# Patient Record
Sex: Female | Born: 1970 | Race: White | Hispanic: No | Marital: Married | Smoking: Never smoker
Health system: Southern US, Community
[De-identification: ages and names within clinical notes are randomized; demographics above are authoritative.]

## PROBLEM LIST (undated history)

## (undated) ENCOUNTER — Inpatient Hospital Stay (HOSPITAL_COMMUNITY): Payer: Self-pay

## (undated) DIAGNOSIS — F419 Anxiety disorder, unspecified: Secondary | ICD-10-CM

## (undated) DIAGNOSIS — R000 Tachycardia, unspecified: Secondary | ICD-10-CM

## (undated) DIAGNOSIS — I82409 Acute embolism and thrombosis of unspecified deep veins of unspecified lower extremity: Secondary | ICD-10-CM

## (undated) DIAGNOSIS — J302 Other seasonal allergic rhinitis: Secondary | ICD-10-CM

## (undated) DIAGNOSIS — Z86718 Personal history of other venous thrombosis and embolism: Secondary | ICD-10-CM

## (undated) DIAGNOSIS — K449 Diaphragmatic hernia without obstruction or gangrene: Secondary | ICD-10-CM

## (undated) DIAGNOSIS — I871 Compression of vein: Secondary | ICD-10-CM

## (undated) DIAGNOSIS — Z8742 Personal history of other diseases of the female genital tract: Secondary | ICD-10-CM

## (undated) DIAGNOSIS — IMO0001 Reserved for inherently not codable concepts without codable children: Secondary | ICD-10-CM

## (undated) HISTORY — DX: Acute embolism and thrombosis of unspecified deep veins of unspecified lower extremity: I82.409

## (undated) HISTORY — PX: WISDOM TOOTH EXTRACTION: SHX21

## (undated) HISTORY — DX: Tachycardia, unspecified: R00.0

## (undated) HISTORY — DX: Other seasonal allergic rhinitis: J30.2

## (undated) HISTORY — DX: Diaphragmatic hernia without obstruction or gangrene: K44.9

---

## 2005-03-01 ENCOUNTER — Emergency Department: Payer: Self-pay | Admitting: Emergency Medicine

## 2005-05-07 ENCOUNTER — Ambulatory Visit: Payer: Self-pay | Admitting: Urology

## 2005-05-07 ENCOUNTER — Ambulatory Visit: Payer: Self-pay | Admitting: Unknown Physician Specialty

## 2006-01-28 ENCOUNTER — Ambulatory Visit: Payer: Self-pay | Admitting: Unknown Physician Specialty

## 2006-02-03 ENCOUNTER — Ambulatory Visit: Payer: Self-pay | Admitting: Unknown Physician Specialty

## 2006-02-18 ENCOUNTER — Ambulatory Visit: Payer: Self-pay | Admitting: Unknown Physician Specialty

## 2006-11-26 ENCOUNTER — Emergency Department: Payer: Self-pay | Admitting: Emergency Medicine

## 2007-07-21 ENCOUNTER — Ambulatory Visit: Payer: Self-pay | Admitting: Urology

## 2007-08-27 ENCOUNTER — Ambulatory Visit: Payer: Self-pay | Admitting: Internal Medicine

## 2008-02-12 ENCOUNTER — Ambulatory Visit: Payer: Self-pay

## 2010-11-29 ENCOUNTER — Ambulatory Visit: Payer: Self-pay | Admitting: Urology

## 2011-07-09 ENCOUNTER — Ambulatory Visit (INDEPENDENT_AMBULATORY_CARE_PROVIDER_SITE_OTHER): Payer: BC Managed Care – PPO | Admitting: Internal Medicine

## 2011-07-09 ENCOUNTER — Encounter: Payer: Self-pay | Admitting: Internal Medicine

## 2011-07-09 VITALS — BP 106/66 | HR 62 | Ht 68.0 in | Wt 142.0 lb

## 2011-07-09 DIAGNOSIS — R000 Tachycardia, unspecified: Secondary | ICD-10-CM

## 2011-07-09 MED ORDER — ATENOLOL 25 MG PO TABS: 25.0000 mg | ORAL_TABLET | Freq: Two times a day (BID) | ORAL | Status: DC

## 2011-07-09 NOTE — Progress Notes (Signed)
[name] is a pleasant 40 y.o. WF patient with a h/o inappropriate sinus tachycardia who presents today to establish EP follow-up. She reports longstanding tachypalpitations for which she has had an extensive evaluation previously at [facility] by Dr Macon Large.  She states that she first developed episodes of "heart racing" in 2002.  She has worn several event monitors which have revealed sinus rhythm/ sinus tachycardia during episodes of "heart racing".  She reports abrupt onset with gradual termination of tachypalpitations with associated shortness of breath and fatigue.  She finds that episodes occur when exercising or when walking up stairs.  She reports that she gets dizzy when she bends over quickly.  Her symptoms were initially well controlled with toprol, however she developed an eventual rash with toprol requiring that she stop this medicine.  She has recently been treated with atenolol 25mg  bid.  She presently feels that her symptoms are reasonably well controlled with this medicine.  Today, she denies symptoms of chest pain, shortness of breath, orthopnea, PND, lower extremity edema, dizziness, presyncope, syncope, or neurologic sequela. The patient is tolerating medications without difficulties and is otherwise without complaint today.   Past Medical History  Diagnosis Date  . Inappropriate sinus tachycardia   . Hiatal hernia   . Seasonal allergies   . Palpitations    No past surgical history on file.  Current Outpatient Prescriptions  Medication Sig Dispense Refill  . atenolol (TENORMIN) 25 MG tablet Take 1 tablet (25 mg total) by mouth 2 (two) times daily.  60 tablet  11  . Cetirizine HCl (ZYRTEC ALLERGY PO) Take by mouth as needed.        . NON FORMULARY Birth control daily         Allergies  Allergen Reactions  . Toprol Xl (Metoprolol Succinate)   . Zithromax (Azithromycin)     History   Social History  . Marital Status: Divorced    Spouse Name: N/A    Number of Children: N/A   . Years of Education: N/A   Occupational History  . Not on file.   Social History Main Topics  . Smoking status: Never Smoker   . Smokeless tobacco: Never Used  . Alcohol Use: No  . Drug Use: No  . Sexually Active: Not on file   Other Topics Concern  . 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    Family History  Problem Relation Age of Onset  . Heart attack Father   . Diabetes Brother   . Heart attack Maternal Grandfather   . Diabetes Father   . Hypertension Father   . Hypertension Brother     ROS- All systems are reviewed and negative except as per the HPI above  Physical Exam: Filed Vitals:   07/09/11 1528  BP: 106/66  Pulse: 62  Height: 5\' 8"  (1.727 m)  Weight: 142 lb (64.411 kg)    GEN- The patient is well appearing, alert and oriented x 3 today.   Head- normocephalic, atraumatic Eyes-  Sclera clear, conjunctiva pink Ears- hearing intact Oropharynx- clear Neck- supple, no JVP Lymph- no cervical lymphadenopathy Lungs- Clear to ausculation bilaterally, normal work of breathing Heart- Regular rate and rhythm, no murmurs, rubs or gallops, PMI not laterally displaced GI- soft, NT, ND, + BS Extremities- no clubbing, cyanosis, or edema MS- no significant deformity or atrophy Skin- no rash or lesion Psych- anxious appearing, full affect Neuro- strength and sensation are  intact  EKG today reveals sinus bradycardia 57 bpm, otherwise normal ekg Echo 12/02/02- LVEF >55%, trivial TR Holter 12/05/02- sinus rhythm and ectopy atrial rhythm, symptoms of "racing" was associated with sinus rhythm, rates 72-93 bpm Holter 06/30/05- sinus rhythm, symptoms of "chest pressure" correlated to sinus rhythm, "racing" was correlated with sinus tachycardia 120s-140s.  Assessment and Plan:

## 2011-07-09 NOTE — Assessment & Plan Note (Signed)
The patient has longstanding hypersensitivity to sinus rhythm.  She has done well with beta blocker therapy.  She presents today to establish ongoing EP care.  She was previously evaluated/ managed by Dr Macon Large at [facility].  As she appears to be doing well at this time, we will continue atenolol. No changes today.  She will return in 12 months.

## 2011-07-09 NOTE — Patient Instructions (Signed)
Your physician wants you to follow-up in: 12 months with Dr Allred You will receive a reminder letter in the mail two months in advance. If you don't receive a letter, please call our office to schedule the follow-up appointment.  

## 2011-07-25 ENCOUNTER — Encounter: Payer: Self-pay | Admitting: Internal Medicine

## 2011-07-28 ENCOUNTER — Encounter: Payer: Self-pay | Admitting: Internal Medicine

## 2011-07-31 ENCOUNTER — Encounter: Payer: Self-pay | Admitting: Internal Medicine

## 2011-12-30 NOTE — L&D Delivery Note (Signed)
Delivery Note  SVD viable female Apgars 9,9 over 1st degree ml lac.  Placenta delivered spontaneously intact with 3VC. Repair with 2-0 Chromic with good support and hemostasis noted and R/V exam confirms.  PH art was sent.  [place] cord blood was not available.  Mother and baby were doing well.  EBL 300cc  Candice Camp, MD

## 2012-02-19 ENCOUNTER — Telehealth: Payer: Self-pay | Admitting: Internal Medicine

## 2012-02-19 NOTE — Telephone Encounter (Addendum)
Pt on atenilol 25mg , pt is pregnant and ob told her she  needs to switch to something safer, can get another med called into [facility] in [place] on [address] pt has tried Toprol and had an allergic reaction

## 2012-02-20 ENCOUNTER — Telehealth: Payer: Self-pay | Admitting: *Deleted

## 2012-02-20 NOTE — Telephone Encounter (Signed)
02/20/12--pt calling stating she has not heard back about what med to take for a. Tachycardia --pt is pregnant and OB-GYN wanted her to check with [place] about med she can take while pregnant--spoke with dr Elease Hashimoto who prescribed labatelol 100mg  caps 1 po BID-- CALLED IN TO [facility] ON [address]--#60 WITH 2 REFILLS--pt aware--nt

## 2012-02-20 NOTE — Telephone Encounter (Signed)
Pt calling back, has not heard from nurse re message yesterday, pt requesting call back today, pls call [phone]

## 2012-02-21 ENCOUNTER — Inpatient Hospital Stay (HOSPITAL_COMMUNITY)
Admission: AD | Admit: 2012-02-21 | Discharge: 2012-02-21 | Disposition: A | Discharge status: Home / Self Care | Payer: BC Managed Care – PPO | Source: Ambulatory Visit | Attending: Obstetrics and Gynecology | Admitting: Obstetrics and Gynecology

## 2012-02-21 ENCOUNTER — Inpatient Hospital Stay (HOSPITAL_COMMUNITY): Payer: BC Managed Care – PPO

## 2012-02-21 ENCOUNTER — Encounter (HOSPITAL_COMMUNITY): Payer: Self-pay | Admitting: *Deleted

## 2012-02-21 DIAGNOSIS — O209 Hemorrhage in early pregnancy, unspecified: Secondary | ICD-10-CM | POA: Insufficient documentation

## 2012-02-21 DIAGNOSIS — R109 Unspecified abdominal pain: Secondary | ICD-10-CM | POA: Insufficient documentation

## 2012-02-21 DIAGNOSIS — O4690 Antepartum hemorrhage, unspecified, unspecified trimester: Secondary | ICD-10-CM

## 2012-02-21 DIAGNOSIS — O200 Threatened abortion: Secondary | ICD-10-CM

## 2012-02-21 LAB — CBC
MCH: 30.6 pg (ref 26.0–34.0)
MCV: 89 fL (ref 78.0–100.0)
Platelets: 162 10*3/uL (ref 150–400)
RDW: 12.7 % (ref 11.5–15.5)

## 2012-02-21 LAB — URINALYSIS, ROUTINE W REFLEX MICROSCOPIC
Bilirubin Urine: NEGATIVE
Specific Gravity, Urine: >1.03 — ABNORMAL HIGH (ref 1.005–1.030)
Urobilinogen, UA: 0.2 mg/dL (ref 0.0–1.0)
pH: 5.5 (ref 5.0–8.0)

## 2012-02-21 LAB — URINE MICROSCOPIC-ADD ON

## 2012-02-21 LAB — POCT PREGNANCY, URINE: Preg Test, Ur: POSITIVE — AB

## 2012-02-21 MED ORDER — HYDROCODONE-ACETAMINOPHEN 5-325 MG PO TABS
2.0000 | ORAL_TABLET | Freq: Once | ORAL | Status: AC
  Administered 2012-02-21: 2 via ORAL
  Filled 2012-02-21: qty 2

## 2012-02-21 MED ORDER — RHO D IMMUNE GLOBULIN 1500 UNIT/2ML IJ SOLN
300.0000 ug | Freq: Once | INTRAMUSCULAR | Status: AC
  Administered 2012-02-21: 300 ug via INTRAMUSCULAR
  Filled 2012-02-21: qty 2

## 2012-02-21 NOTE — Progress Notes (Signed)
BHcg was in 5000 range on Thursday this week.

## 2012-02-21 NOTE — ED Provider Notes (Signed)
History   Pt presents today c/o sudden onset of vag bleeding that began around 8pm. She states she has had lower abd cramping all day. She denies vag dc, irritation, fever, dysuria, or any other sx at this time. Her last episode of intercourse was yesterday.  Chief Complaint  Patient presents with  . Vaginal Bleeding   HPI  OB History    Grav Para Term Preterm Abortions TAB SAB Ect Mult Living   3 2 2       2       Past Medical History  Diagnosis Date  . Inappropriate sinus tachycardia   . Seasonal allergies   . Palpitations     Past Surgical History  Procedure Date  . Wisdom tooth extraction     Family History  Problem Relation Age of Onset  . Heart attack Father   . Diabetes Brother   . Heart attack Maternal Grandfather   . Diabetes Father   . Hypertension Father   . Hypertension Brother     History  Substance Use Topics  . Smoking status: Never Smoker   . Smokeless tobacco: Never Used  . Alcohol Use: No    Allergies:  Allergies  Allergen Reactions  . Cheese Anaphylaxis  . Avelox (Moxifloxacin Hcl In Nacl) Hives  . Toprol Xl (Metoprolol Succinate) Hives  . Zithromax (Azithromycin) Hives    Prescriptions prior to admission  Medication Sig Dispense Refill  . cetirizine (ZYRTEC) 10 MG tablet Take 10 mg by mouth daily.      Marland Kitchen ibuprofen (ADVIL,MOTRIN) 200 MG tablet Take 400 mg by mouth every 6 (six) hours as needed. Head aches      . labetalol (NORMODYNE) 100 MG tablet Take 100 mg by mouth 2 (two) times daily.      Marland Kitchen PRESCRIPTION MEDICATION 1 tablet daily. Birth control, pt does not know name of drug or strength.        Review of Systems  Constitutional: Negative for fever and chills.  Eyes: Negative for blurred vision and double vision.  Respiratory: Negative for cough, hemoptysis, sputum production, shortness of breath and wheezing.   Cardiovascular: Negative for chest pain and palpitations.  Gastrointestinal: Positive for abdominal pain. Negative for  nausea, vomiting, diarrhea and constipation.  Genitourinary: Negative for dysuria, urgency, frequency and hematuria.  Neurological: Negative for dizziness and headaches.  Psychiatric/Behavioral: Negative for depression and suicidal ideas.   Physical Exam   Blood pressure 113/78, pulse 97, temperature 98 F (36.7 C), temperature source Oral, resp. rate 18, last menstrual period 01/19/2012.  Physical Exam  Nursing note and vitals reviewed. Constitutional: She is oriented to person, place, and time. She appears well-developed and well-nourished. No distress.  HENT:  Head: Normocephalic and atraumatic.  Eyes: EOM are normal. Pupils are equal, round, and reactive to light.  GI: Soft. She exhibits no distension and no mass. There is tenderness. There is no rebound and no guarding.  Genitourinary: There is bleeding around the vagina. No vaginal discharge found.       Cervix Lg/closed. Moderate amount of red blood noted in vag vault. Uterus slightly tender to palpation. No adnexal masses noted.  Neurological: She is alert and oriented to person, place, and time.  Skin: Skin is warm and dry. She is not diaphoretic.  Psychiatric: She has a normal mood and affect. Her behavior is normal. Judgment and thought content normal.    MAU Course  Procedures  Wet prep and GC/Chlamydia cultures done.  Results for orders placed during  the hospital encounter of 02/21/12 (from the past 24 hour(s))  URINALYSIS, ROUTINE W REFLEX MICROSCOPIC     Status: Abnormal   Collection Time   02/21/12  8:30 PM      Component Value Range   Color, Urine YELLOW  YELLOW    APPearance CLOUDY (*) CLEAR    Specific Gravity, Urine >1.030 (*) 1.005 - 1.030    pH 5.5  5.0 - 8.0    Glucose, UA NEGATIVE  NEGATIVE (mg/dL)   Hgb urine dipstick LARGE (*) NEGATIVE    Bilirubin Urine NEGATIVE  NEGATIVE    Ketones, ur NEGATIVE  NEGATIVE (mg/dL)   Protein, ur NEGATIVE  NEGATIVE (mg/dL)   Urobilinogen, UA 0.2  0.0 - 1.0 (mg/dL)    Nitrite NEGATIVE  NEGATIVE    Leukocytes, UA NEGATIVE  NEGATIVE   URINE MICROSCOPIC-ADD ON     Status: Normal   Collection Time   02/21/12  8:30 PM      Component Value Range   Squamous Epithelial / LPF RARE  RARE    RBC / HPF TOO NUMEROUS TO COUNT  <3 (RBC/hpf)  POCT PREGNANCY, URINE     Status: Abnormal   Collection Time   02/21/12  8:43 PM      Component Value Range   Preg Test, Ur POSITIVE (*) NEGATIVE   ABO/RH     Status: Normal   Collection Time   02/21/12  8:46 PM      Component Value Range   ABO/RH(D) A NEG    CBC     Status: Normal   Collection Time   02/21/12  8:46 PM      Component Value Range   WBC 6.7  4.0 - 10.5 (K/uL)   RBC 4.71  3.87 - 5.11 (MIL/uL)   Hemoglobin 14.4  12.0 - 15.0 (g/dL)   HCT 40.9  81.1 - 91.4 (%)   MCV 89.0  78.0 - 100.0 (fL)   MCH 30.6  26.0 - 34.0 (pg)   MCHC 34.4  30.0 - 36.0 (g/dL)   RDW 78.2  95.6 - 21.3 (%)   Platelets 162  150 - 400 (K/uL)  HCG, QUANTITATIVE, PREGNANCY     Status: Abnormal   Collection Time   02/21/12  8:46 PM      Component Value Range   hCG, Beta Chain, Quant, S 10323 (*) <5 (mIU/mL)  WET PREP, GENITAL     Status: Abnormal   Collection Time   02/21/12  9:00 PM      Component Value Range   Yeast Wet Prep HPF POC NONE SEEN  NONE SEEN    Trich, Wet Prep NONE SEEN  NONE SEEN    Clue Cells Wet Prep HPF POC NONE SEEN  NONE SEEN    WBC, Wet Prep HPF POC FEW (*) NONE SEEN   RH IG WORKUP     Status: Normal (Preliminary result)   Collection Time   02/21/12 10:01 PM      Component Value Range   Gestational Age(Wks) 4.5     ABO/RH(D) A NEG     Antibody Screen PENDING      US Ob Comp Less 14 Wks  02/21/2012  *RADIOLOGY REPORT*  Clinical Data: bleeding; ;  OBSTETRIC <14 WK [place] AND TRANSVAGINAL OB US  Technique: Both transabdominal and transvaginal ultrasound examinations were performed for complete evaluation of the gestation as well as the maternal uterus, adnexal regions, and pelvic cul-de-sac.  Comparison: None.  Findings:  There is a  single intrauterine gestation.  Mean sac diameter is 10.8 mm for an estimated gestational age of [redacted] weeks 6 days.  Yolk sac is present.  No fetal pole currently visualized. There is a large subchorionic hemorrhage present as well.  This somewhat deforms the gestational sac.  Ovaries are symmetric in size and echotexture.  Right corpus luteal cyst present.  There is a 1.4 cm hypoechoic posterior intramural fibroid.  No free fluid.  IMPRESSION: 5-week-6-day intrauterine pregnancy.  No fetal pole currently. There is a large subchorionic hemorrhage present.  Original Report Authenticated By: Cyndie Chime, M.D.   US Ob Transvaginal  02/21/2012  *RADIOLOGY REPORT*  Clinical Data: bleeding; ;  OBSTETRIC <14 WK [place] AND TRANSVAGINAL OB US  Technique: Both transabdominal and transvaginal ultrasound examinations were performed for complete evaluation of the gestation as well as the maternal uterus, adnexal regions, and pelvic cul-de-sac.  Comparison: None.  Findings: There is a single intrauterine gestation.  Mean sac diameter is 10.8 mm for an estimated gestational age of [redacted] weeks 6 days.  Yolk sac is present.  No fetal pole currently visualized. There is a large subchorionic hemorrhage present as well.  This somewhat deforms the gestational sac.  Ovaries are symmetric in size and echotexture.  Right corpus luteal cyst present.  There is a 1.4 cm hypoechoic posterior intramural fibroid.  No free fluid.  IMPRESSION: 5-week-6-day intrauterine pregnancy.  No fetal pole currently. There is a large subchorionic hemorrhage present.  Original Report Authenticated By: Cyndie Chime, M.D.    Discussed pt with Dr. Senaida Ores. She wants pt to f/u in office on Monday. Assessment and Plan  Vag bleeding in preg: discussed with pt at length. She will take tylenol for prn pain. Discussed possibility of threatened miscarriage. Discussed diet, activity, risks, and precautions. She will call Dr. Berenda Morale office on Monday.    Clinton Gallant. Chianna Spirito III, DrHSc, MPAS, PA-C  02/21/2012, 8:59 PM   Henrietta Hoover, PA 02/21/12 2258

## 2012-02-23 LAB — GC/CHLAMYDIA PROBE AMP, GENITAL
Chlamydia, DNA Probe: NEGATIVE
GC Probe Amp, Genital: NEGATIVE

## 2012-02-24 LAB — RH IG WORKUP (INCLUDES ABO/RH)
ABO/RH(D): A NEG
Gestational Age(Wks): 4.5
Unit division: 0

## 2012-03-01 ENCOUNTER — Telehealth: Payer: Self-pay | Admitting: Internal Medicine

## 2012-03-01 NOTE — Telephone Encounter (Signed)
lmom for patient to call me back. 

## 2012-03-01 NOTE — Telephone Encounter (Signed)
Please return call to patient [phone]  Experiencing rapid heart beat, SOB , no dizziness.  Please return call to patient.

## 2012-03-01 NOTE — Telephone Encounter (Signed)
She was changed to Labetalol 100mg  bid, she is [redacted] weeks pregnant.  She states that when she drys her hair she gets exhausted.  It was racing this morning and has now stopped If she gets up and moves around it starts racing again.

## 2012-03-01 NOTE — Telephone Encounter (Signed)
Fu call °Patient returning your call °

## 2012-03-03 NOTE — Telephone Encounter (Signed)
Discussed with Dr Johney Frame It is okay for her to take the Labetalol twice daily. He also does not see where it would be hazardous for her to be pregnant with her condition.  I have left her a message to call me back

## 2012-03-23 LAB — OB RESULTS CONSOLE RPR: RPR: NONREACTIVE

## 2012-05-10 ENCOUNTER — Telehealth: Payer: Self-pay | Admitting: Internal Medicine

## 2012-05-10 DIAGNOSIS — R000 Tachycardia, unspecified: Secondary | ICD-10-CM

## 2012-05-10 DIAGNOSIS — R0602 Shortness of breath: Secondary | ICD-10-CM

## 2012-05-10 NOTE — Telephone Encounter (Signed)
Pt rtn call , pls call [phone]

## 2012-05-10 NOTE — Telephone Encounter (Signed)
Pt heart meds where changed about 2 months ago and it is not working because pt still has a rapid heart rate it is at 100 at rest

## 2012-05-10 NOTE — Telephone Encounter (Signed)
Called patient and left her a message to call me back.

## 2012-05-11 ENCOUNTER — Encounter: Payer: Self-pay | Admitting: Internal Medicine

## 2012-05-11 NOTE — Telephone Encounter (Signed)
Follow up:     Patient called in wanting to speak with you about what she called about yesterday.  Please call back.

## 2012-05-11 NOTE — Telephone Encounter (Signed)
This encounter was created in error - please disregard.

## 2012-05-11 NOTE — Telephone Encounter (Signed)
She says her resting HR is 100 She is [redacted] weeks pregnant  Went for 6 weeks and was out of commission She was walking in a house Sat. And had to stop and catch her breath Wanting to know if she can increase her Med or something else she try, or is this just something she is going to have to live with until after the pregnancy

## 2012-05-14 NOTE — Telephone Encounter (Signed)
Patient is aware Dr.Taylor's nurse will return her call.

## 2012-05-14 NOTE — Telephone Encounter (Signed)
Fu msg Pt hasnt heard anything and would like a call today

## 2012-05-14 NOTE — Telephone Encounter (Signed)
Spoke with Dr Johney Frame and he wants her to come by and get an EKG.  I have called her back and let her know.  She is with a client at the moment and will call me back and let me know if she can come today before 5 or if it will be Monday

## 2012-05-14 NOTE — Telephone Encounter (Signed)
Follow up on previous call.  Patient calling back , need a call from Dr. Johney Frame to discuss her message she sent on Monday.

## 2012-05-14 NOTE — Telephone Encounter (Signed)
Patient came in for EKG her HR was 88.  So we walked her and her HR was 111  She is significantly SOB with walking just short distances.  Both were normal.  Discussed with Dr Johney Frame.  Will try and add on for Monday with Solon Palm and get an Echo .  I have lmom for patient with the above and given her times that would be good to come on Monday  I will try her back first thing Monday morning to see if we can nail down a time

## 2012-05-14 NOTE — Telephone Encounter (Signed)
Patient called, upset stated she called Monday 05/10/12 and has not heard from our office.States Labetolol is not working.States she is [redacted] weeks pregnant and her atenolol was changed 2 months ago to Labetolol 100 mg bid.States her resting heart rate is no less than 100 beats/min.Patient feels tired,sob and wants to know what to do.

## 2012-05-17 ENCOUNTER — Encounter: Payer: Self-pay | Admitting: Nurse Practitioner

## 2012-05-17 ENCOUNTER — Ambulatory Visit (HOSPITAL_COMMUNITY): Payer: BC Managed Care – PPO | Attending: Internal Medicine

## 2012-05-17 ENCOUNTER — Telehealth: Payer: Self-pay | Admitting: Physician Assistant

## 2012-05-17 ENCOUNTER — Ambulatory Visit (INDEPENDENT_AMBULATORY_CARE_PROVIDER_SITE_OTHER): Payer: BC Managed Care – PPO | Admitting: Nurse Practitioner

## 2012-05-17 ENCOUNTER — Telehealth: Payer: Self-pay | Admitting: Internal Medicine

## 2012-05-17 ENCOUNTER — Emergency Department (HOSPITAL_COMMUNITY)
Admission: EM | Admit: 2012-05-17 | Discharge: 2012-05-18 | Disposition: A | Discharge status: Home / Self Care | Payer: BC Managed Care – PPO | Attending: Emergency Medicine | Admitting: Emergency Medicine

## 2012-05-17 ENCOUNTER — Other Ambulatory Visit: Payer: Self-pay

## 2012-05-17 ENCOUNTER — Inpatient Hospital Stay (HOSPITAL_COMMUNITY)
Admission: AD | Admit: 2012-05-17 | Discharge: 2012-05-17 | Discharge status: Against Medical Advice | Payer: BC Managed Care – PPO | Source: Ambulatory Visit | Attending: Obstetrics and Gynecology | Admitting: Obstetrics and Gynecology

## 2012-05-17 VITALS — BP 100/68 | HR 80 | Ht 68.0 in | Wt 164.0 lb

## 2012-05-17 DIAGNOSIS — R0602 Shortness of breath: Secondary | ICD-10-CM | POA: Insufficient documentation

## 2012-05-17 DIAGNOSIS — R000 Tachycardia, unspecified: Secondary | ICD-10-CM | POA: Insufficient documentation

## 2012-05-17 DIAGNOSIS — D689 Coagulation defect, unspecified: Secondary | ICD-10-CM | POA: Insufficient documentation

## 2012-05-17 DIAGNOSIS — O9989 Other specified diseases and conditions complicating pregnancy, childbirth and the puerperium: Secondary | ICD-10-CM | POA: Insufficient documentation

## 2012-05-17 DIAGNOSIS — R0609 Other forms of dyspnea: Secondary | ICD-10-CM | POA: Insufficient documentation

## 2012-05-17 DIAGNOSIS — R5381 Other malaise: Secondary | ICD-10-CM

## 2012-05-17 DIAGNOSIS — R5383 Other fatigue: Secondary | ICD-10-CM | POA: Insufficient documentation

## 2012-05-17 DIAGNOSIS — R0989 Other specified symptoms and signs involving the circulatory and respiratory systems: Secondary | ICD-10-CM

## 2012-05-17 DIAGNOSIS — I2510 Atherosclerotic heart disease of native coronary artery without angina pectoris: Secondary | ICD-10-CM | POA: Insufficient documentation

## 2012-05-17 DIAGNOSIS — I059 Rheumatic mitral valve disease, unspecified: Secondary | ICD-10-CM | POA: Insufficient documentation

## 2012-05-17 DIAGNOSIS — O99891 Other specified diseases and conditions complicating pregnancy: Secondary | ICD-10-CM | POA: Insufficient documentation

## 2012-05-17 HISTORY — PX: TRANSTHORACIC ECHOCARDIOGRAM: SHX275

## 2012-05-17 LAB — D-DIMER, QUANTITATIVE: D-Dimer, Quant: 0.65 ug/mL-FEU — ABNORMAL HIGH (ref 0.00–0.48)

## 2012-05-17 NOTE — ED Notes (Signed)
Pt had a positive D-dimer , pt was insturscted to go to [facility] ED. Pt was then told to transfer to [facility] for further eval.

## 2012-05-17 NOTE — Telephone Encounter (Signed)
Received call from [facility] labs regarding elevated d-dimer. Patient is pregnant, this was to r/o PE. I attempted to call the # listed in our records but this appears to be a work number. She has not left a home number. I called her emergency contact twice but did not receive any answer. I looked up her office number and was able to finally reach her. Difficult to know significance of d-dimer but with symptoms, PE needs to be excluded. I also discussed this with Dr. Elease Hashimoto and we feel she should proceed to [facility] for evaluation to rule out PE. I called MAU triage to inform them of the situation. The patient verbalized understanding and gratitude and will be going straight to ER. Yeiden Frenkel PA-C

## 2012-05-17 NOTE — Progress Notes (Signed)
Patient Name: [name] Date of Encounter: 05/17/2012  Primary Care Provider:  No primary provider on file. Primary Cardiologist:  J. Allred, MD  Patient Profile  41 year old female with history of inappropriate sinus tachycardia who presents for followup.  Problem List   Past Medical History  Diagnosis Date  . Inappropriate sinus tachycardia   . Seasonal allergies   . Palpitations   . Fatigue    Past Surgical History  Procedure Date  . Wisdom tooth extraction     Allergies  Allergies  Allergen Reactions  . Cheese Anaphylaxis  . Avelox (Moxifloxacin Hcl In Nacl) Hives  . Toprol Xl (Metoprolol Succinate) Hives  . Zithromax (Azithromycin) Hives    HPI  41 year old female with the above problem list.  She has a history of inappropriate sinus tachycardia dating back to her last pregnancy in 1999.  This had previously been controlled with atenolol therapy and she has done well over the years.  She has been very active and is an avid runner.  She recently discovered that she was pregnant and has been switched from atenolol to labetalol therapy.  Since then, she has had recurrence of fatigue which she previously associated with her tachycardia.  She presented to the office last Friday and an ECG was performed showing sinus rhythm.  She was walked and her heart rate reached 111 beats per minute.  She did complain of dyspnea and was noticeably fatigued.  She was then set up to followup today.  She denies any chest pain, PND, orthopnea, dizziness, syncope, nausea, vomiting, edema, or early satiety.  She is less concerned about palpitations when she is about her drop-off in exercise tolerance.  Home Medications  Prior to Admission medications   Medication Sig Start Date End Date Taking? Authorizing Provider  cetirizine (ZYRTEC) 10 MG tablet Take 10 mg by mouth daily.   Yes Historical Provider, MD  ibuprofen (ADVIL,MOTRIN) 200 MG tablet Take 400 mg by mouth every 6 (six) hours as  needed. Head aches   Yes Historical Provider, MD  labetalol (NORMODYNE) 100 MG tablet Take 100 mg by mouth 2 (two) times daily.   Yes Historical Provider, MD   Review of Systems Fatigue and dyspnea as outlined above.  No chest pain, sob, n, v, dizziness, syncope, edema, early satiety, dysuria, dark stools, blood in stools, diarrhea, rash/skin changes, fevers, chills, wt loss/gain.  Otherwise all systems reviewed and negative.  Physical Exam  Blood pressure 100/68, pulse 80, height 5\' 8"  (1.727 m), weight 164 lb (74.39 kg), last menstrual period 01/19/2012.  General: Pleasant, NAD Psych: Normal affect. Neuro: Alert and oriented X 3. Moves all extremities spontaneously. HEENT: Normal  Neck: Supple without bruits or JVD. Lungs:  Resp regular and unlabored, CTA. Heart: RRR no s3, s4, or murmurs. Abdomen: Soft, non-tender, non-distended, BS + x 4.  Extremities: No clubbing, cyanosis or edema. DP/PT/Radials 2+ and equal bilaterally.  Accessory Clinical Findings  ECG - RSR, no acute st/t changes.  Echo to be performed today and pending.  Assessment & Plan  1.  DOE & Fatigue:  Pt with significant reduction in exercise tolerance since switching from atenolol to labetalol in the setting of pregnancy (17 wks).  We will check a d-dimer today along with obtain a 2d echo to r/o change in LV fxn.  She had previously been walked and noted to have rise in HR to 111 with significant dyspnea.  I have discussed her case with Dr. Johney Frame and we do not feel  that there is a safer agent then labetalol however would defer back to her obstetrician.  D-dimer is abnormal, we will plan a CT angiography chest to rule out pulmonary embolus.  Recommended that if contrast is given, thyroid function testing would have to be undertaken upon the newborn following birth.  2.  H/O inappropriate sinus tachycardia:  See above.  3.  Disposition:  Echocardiogram and d-dimer as above.  We will be in touch with the patient  regarding results and will arrange followup with Dr. Johney Frame.  Nicolasa Ducking, NP 05/17/2012, 4:21 PM

## 2012-05-17 NOTE — Telephone Encounter (Signed)
LMOM for pt to come in at 3:15pm to see our PA and then have her echo at 4pm

## 2012-05-17 NOTE — MAU Note (Signed)
Patient refuses [facility] transport. Risks discussed with patient by Mayer Camel FNP and Cira Servant RN. Pt signed elopement form for refusal of transfer. Patient will transport self to [facility] ED. Charge nurse at [facility] made aware.

## 2012-05-17 NOTE — ED Notes (Signed)
[id number]<AV> Expected date:<BR> Expected time:<BR> Means of arrival:<BR> Comments:<BR> From [facility]-R/o PE

## 2012-05-17 NOTE — MAU Note (Signed)
Pt reports she was seen at cardio today and was today. Pt reports she has atrial tachycardia. Pt says for the last 3-4 weeks she has not felt well, tired all the time. Had EKG on Friday. Today had d-dimer and it positive.

## 2012-05-17 NOTE — Patient Instructions (Addendum)
Your physician recommends that you schedule a follow-up appointment in: July Your physician recommends that you have lab work drawn today (D-Dimer)  I have called the patient in regards to echo--it is normal( Dr Tenny Craw read) Will call her back with lab results

## 2012-05-17 NOTE — MAU Provider Note (Signed)
History     CSN: [id number]  Arrival date & time 05/17/12  2048   None     Chief Complaint  Patient presents with  . Shortness of Breath    HPI [name] is a 41 y.o. female @ [redacted]w[redacted]d gestation who presents to MAU for shortness of breath. Symptoms have been off and on since changed blood pressure medication changed first of March. About 3 weeks ago symptoms got worse with shortness of breath. Went to cardiologist last week and had EKG, D-dimer and echocardiogram. Patient got a call today from cardiologist and told her D-dimer was elevated and to come to MAU immediately.   Past Medical History  Diagnosis Date  . Inappropriate sinus tachycardia   . Seasonal allergies   . Palpitations   . Fatigue     Past Surgical History  Procedure Date  . Wisdom tooth extraction     Family History  Problem Relation Age of Onset  . Heart attack Father   . Diabetes Brother   . Heart attack Maternal Grandfather   . Diabetes Father   . Hypertension Father   . Hypertension Brother     History  Substance Use Topics  . Smoking status: Never Smoker   . Smokeless tobacco: Never Used  . Alcohol Use: No    OB History    Grav Para Term Preterm Abortions TAB SAB Ect Mult Living   3 2 2       2       Review of Systems  Constitutional: Positive for chills and fatigue. Negative for fever and diaphoresis.  HENT: Negative for ear pain, congestion, sore throat, facial swelling, neck pain, neck stiffness, dental problem and sinus pressure.   Eyes: Negative for photophobia, pain and discharge.  Respiratory: Positive for shortness of breath. Negative for cough, chest tightness and wheezing.   Cardiovascular: Negative for chest pain, palpitations and leg swelling.  Gastrointestinal: Negative for nausea, vomiting, abdominal pain, diarrhea, constipation and abdominal distention.  Genitourinary: Negative for dysuria, frequency, flank pain, vaginal bleeding, vaginal discharge and difficulty urinating.    Musculoskeletal: Negative for myalgias, back pain and gait problem.  Skin: Negative for color change and rash.  Neurological: Negative for dizziness, speech difficulty, weakness, light-headedness, numbness and headaches.  Psychiatric/Behavioral: Negative for confusion and agitation. The patient is nervous/anxious.     Allergies  Cheese; Avelox; Toprol xl; and Zithromax  Home Medications  No current outpatient prescriptions on file.  BP 129/73  Pulse 103  Resp 18  SpO2 100%  LMP 01/19/2012  Physical Exam  Nursing note and vitals reviewed. Constitutional: She is oriented to person, place, and time. She appears well-developed and well-nourished.  HENT:  Head: Normocephalic.  Eyes: EOM are normal.  Neck: Neck supple.  Cardiovascular:       Tachycardia   Pulmonary/Chest: Effort normal.       Slightly decreased breath sounds RLL  Abdominal: Soft. There is no tenderness.       + FHT's  Musculoskeletal: Normal range of motion.  Neurological: She is alert and oriented to person, place, and time. No cranial nerve deficit.  Skin: Skin is warm and dry.  Psychiatric: Her behavior is normal. Judgment and thought content normal. Her mood appears anxious.   I spoke with Dr. Ambrose Mantle and he request that I contact [facility] for plan of care.  ED Course: I spoke with the MD on call for [facility] and she reviewed the EKG and echo and request that the patient  go to [facility] for evaluation of the shortness of breath.   Procedures  MDM: I spoke with Dr. Roselyn Bering at [facility] and he will accept the transfer.    The patient is very upset and crying. I explained that in pregnancy due to the hypercoagulable state that the D-dimer may be falsely elevated but she should go to [facility] for further evaluation. Patient voices understanding and agrees to plan of care. Assessment: shortness of breath in pregnancy  Plan:  Transfer to [facility] for evaluation  NOTE:  Patient does not want to go to [facility]  via [facility]. She will have her husband drive her there. Discussed risk with patient and she acknowledges understanding.

## 2012-05-17 NOTE — Telephone Encounter (Signed)
Called by [facility] - pt is undergoing w/u for SOB and had d dimer sent that was abnl.  Echo done today was normal - nl EF and nl valves.  She was instructed by Corinda Gubler PA to go to ED for PE w/u.  [facility]  Advised [facility] to send pt to [facility] ED since it is closer.  With a normal echo, her SOB is unlikely cardiac in nature.  She does not need to be transferred to [facility] for cardiac w/u.  For her SOB, PE is one possibility given abnl  Dimer, but this could also be do to pregnancy itself.  Defer further w/u and management to the ED.

## 2012-05-18 ENCOUNTER — Emergency Department (HOSPITAL_COMMUNITY): Payer: BC Managed Care – PPO

## 2012-05-18 LAB — POCT I-STAT, CHEM 8
BUN: 7 mg/dL (ref 6–23)
HCT: 38 % (ref 36.0–46.0)
Sodium: 140 mEq/L (ref 135–145)
TCO2: 23 mmol/L (ref 0–100)

## 2012-05-18 MED ORDER — IOHEXOL 300 MG/ML  SOLN
100.0000 mL | Freq: Once | INTRAMUSCULAR | Status: AC | PRN
  Administered 2012-05-18: 100 mL via INTRAVENOUS

## 2012-05-18 NOTE — ED Notes (Signed)
MD at bedside. 

## 2012-05-18 NOTE — ED Notes (Signed)
Patient transported to CT and returned 

## 2012-05-18 NOTE — Discharge Instructions (Signed)
Your workup today has not shown a pulmonary embolus.  Please follow up with your cardiologist and ob as scheduled.

## 2012-05-19 NOTE — ED Provider Notes (Signed)
History     CSN: [id number]  Arrival date & time 05/17/12  2256   First MD Initiated Contact with Patient 05/18/12 [phone]      Chief Complaint  Patient presents with  . Shortness of Breath    (Consider location/radiation/quality/duration/timing/severity/associated sxs/prior treatment) HPI 41 year old G3 P2 at 18 weeks presents to the emergency department after being called at home with positive lab value. Patient was seen by cardiology and is having ongoing workup for dyspnea on exertion, tachycardia, and fatigue. Patient has history of tachycardia and had been on Toprol prior to becoming pregnant. She's been switched to labetalol. She reports since being on labetalol she has had persistent symptoms. Cardiology drew a d-dimer which is positive. Patient initially went to [facility] but was told to come to [place] as they could not work her up for PE. Patient denies any leg swelling. No prior history of PE or DVT.  Past Medical History  Diagnosis Date  . Inappropriate sinus tachycardia   . Seasonal allergies   . Palpitations   . Fatigue     Past Surgical History  Procedure Date  . Wisdom tooth extraction     Family History  Problem Relation Age of Onset  . Heart attack Father   . Diabetes Brother   . Heart attack Maternal Grandfather   . Diabetes Father   . Hypertension Father   . Hypertension Brother     History  Substance Use Topics  . Smoking status: Never Smoker   . Smokeless tobacco: Never Used  . Alcohol Use: No    OB History    Grav Para Term Preterm Abortions TAB SAB Ect Mult Living   3 2 2       2       Review of Systems  All other systems reviewed and are negative.    Allergies  Cheese; Avelox; Toprol xl; and Zithromax  Home Medications   Current Outpatient Rx  Name Route Sig Dispense Refill  . ACETAMINOPHEN 500 MG PO TABS Oral Take 1,000 mg by mouth every 6 (six) hours as needed. Takes for pain    . CALCIUM CARBONATE ANTACID 500 MG PO CHEW  Oral Chew 2 tablets by mouth daily as needed. Takes for indigestion    . CETIRIZINE HCL 10 MG PO TABS Oral Take 10 mg by mouth daily.    Marland Kitchen LABETALOL HCL 100 MG PO TABS Oral Take 100 mg by mouth 2 (two) times daily.      BP 110/72  Pulse 82  Temp(Src) 98.2 F (36.8 C) (Oral)  Resp 18  SpO2 100%  LMP 01/19/2012  Physical Exam  Nursing note and vitals reviewed. Constitutional: She is oriented to person, place, and time. She appears well-developed and well-nourished.  HENT:  Head: Normocephalic and atraumatic.  Nose: Nose normal.  Mouth/Throat: Oropharynx is clear and moist.  Eyes: Conjunctivae and EOM are normal. Pupils are equal, round, and reactive to light.  Neck: Normal range of motion. Neck supple. No JVD present. No tracheal deviation present. No thyromegaly present.  Cardiovascular: Normal rate, regular rhythm, normal heart sounds and intact distal pulses.  Exam reveals no gallop and no friction rub.   No murmur heard. Pulmonary/Chest: Effort normal and breath sounds normal. No stridor. No respiratory distress. She has no wheezes. She has no rales. She exhibits no tenderness.  Abdominal: Soft. Bowel sounds are normal. She exhibits mass (gravid uterus). She exhibits no distension. There is no tenderness. There is no rebound and no guarding.  Musculoskeletal: Normal range of motion. She exhibits no edema and no tenderness.  Lymphadenopathy:    She has no cervical adenopathy.  Neurological: She is oriented to person, place, and time. She exhibits normal muscle tone. Coordination normal.  Skin: Skin is dry. No rash noted. No erythema. No pallor.  Psychiatric: She has a normal mood and affect. Her behavior is normal. Judgment and thought content normal.    ED Course  Procedures (including critical care time)  Labs Reviewed  POCT I-STAT, CHEM 8 - Abnormal; Notable for the following:    Calcium, Ion 1.39 (*)    All other components within normal limits  LAB REPORT - SCANNED    Ct Angio Chest W/cm &/or Wo Cm  05/18/2012  *RADIOLOGY REPORT*  Clinical Data: Shortness of breath, elevated D-dimer, pregnant.  CT ANGIOGRAPHY CHEST  Technique:  Multidetector CT imaging of the chest using the standard protocol during bolus administration of intravenous contrast. Multiplanar reconstructed images including MIPs were obtained and reviewed to evaluate the vascular anatomy.  Contrast: OMNIPAQUE IOHEXOL 300 MG/ML  SOLN  Comparison: None.  Findings: No pulmonary embolism.  Normal caliber aorta.  Normal heart size.  No pericardial effusion.  Trace pleural effusions.  Limited images through the upper abdomen show no acute abnormality.  No intrathoracic lymphadenopathy.  Central airways are patent. Lungs are clear.  No pneumothorax.  No acute osseous finding.  IMPRESSION: No pulmonary embolism.  Trace pleural effusions.  Otherwise, no acute intrathoracic process.  Original Report Authenticated By: Waneta Martins, M.D.     1. Dyspnea on exertion   2. Tachycardia   3. Fatigue       MDM  41 year old female with positive d-dimer in pregnancy. Discussed with patient need for definitive testing for her d-dimer, although I feel is most likely due to her being pregnant rather than PE. However given her symptoms we'll proceed with CT angio chest.        Olivia Mackie, MD 05/19/12 [phone]

## 2012-06-29 ENCOUNTER — Other Ambulatory Visit: Payer: Self-pay | Admitting: Cardiovascular Disease

## 2012-07-14 ENCOUNTER — Encounter: Payer: Self-pay | Admitting: Internal Medicine

## 2012-07-14 ENCOUNTER — Ambulatory Visit (INDEPENDENT_AMBULATORY_CARE_PROVIDER_SITE_OTHER): Payer: BC Managed Care – PPO | Admitting: Internal Medicine

## 2012-07-14 VITALS — BP 111/69 | HR 89 | Resp 20 | Ht 68.0 in | Wt 172.0 lb

## 2012-07-14 DIAGNOSIS — R0609 Other forms of dyspnea: Secondary | ICD-10-CM

## 2012-07-14 DIAGNOSIS — R000 Tachycardia, unspecified: Secondary | ICD-10-CM

## 2012-07-14 MED ORDER — LABETALOL HCL 100 MG PO TABS: 200.0000 mg | ORAL_TABLET | Freq: Two times a day (BID) | ORAL | Status: DC

## 2012-07-14 NOTE — Patient Instructions (Addendum)
Your physician recommends that you schedule a follow-up appointment in: 2 months with Dr Johney Frame  Your physician has recommended you make the following change in your medication:  1) Increase your Labetalol to 200mg  twice daily

## 2012-07-14 NOTE — Progress Notes (Signed)
   Patient Name: [name] Date of Encounter: 07/14/2012  Patient Profile  41 year old female with history of inappropriate sinus tachycardia who presents for followup.  Problem List   Past Medical History  Diagnosis Date  . Inappropriate sinus tachycardia   . Seasonal allergies   . Palpitations   . Fatigue    Past Surgical History  Procedure Date  . Wisdom tooth extraction     Allergies  Allergies  Allergen Reactions  . Cheese Anaphylaxis  . Avelox (Moxifloxacin Hcl In Nacl) Hives  . Toprol Xl (Metoprolol Succinate) Hives  . Zithromax (Azithromycin) Hives    HPI  [name] today presents for follow-up.  She recently was seen by Ward Givens (see his note for details).  Since that time, she continues to have sinus tachycardia with associated SOB with moderate activity.  She feels that this all began when she switched from atenolol to labetolol.  She has not improved since her last visit. She is presently [redacted] weeks pregnant.  Prior workup including chest CT and echo were unrevealing.   She denies any chest pain, PND, orthopnea, dizziness, syncope, nausea, vomiting, edema, or early satiety.   Home Medications  Prior to Admission medications   Medication Sig Start Date End Date Taking? Authorizing Provider  cetirizine (ZYRTEC) 10 MG tablet Take 10 mg by mouth daily.   Yes Historical Provider, MD  ibuprofen (ADVIL,MOTRIN) 200 MG tablet Take 400 mg by mouth every 6 (six) hours as needed. Head aches   Yes Historical Provider, MD  labetalol (NORMODYNE) 100 MG tablet Take 100 mg by mouth 2 (two) times daily.   Yes Historical Provider, MD   Review of Systems Fatigue and dyspnea as outlined above.  No chest pain, sob, n, v, dizziness, syncope, edema, early satiety, dysuria, dark stools, blood in stools, diarrhea, rash/skin changes, fevers, chills, wt loss/gain.  Otherwise all systems reviewed and negative.  Physical Exam  Blood pressure 111/69, pulse 89, resp. rate 20, height 5'  8" (1.727 m), weight 172 lb (78.019 kg), last menstrual period 01/19/2012, SpO2 98.00%.  General: Pleasant, NAD Psych: Normal affect. Neuro: Alert and oriented X 3. Moves all extremities spontaneously. HEENT: Normal  Neck: Supple without bruits or JVD. Lungs:  Resp regular and unlabored, CTA. Heart: RRR no s3, s4, or murmurs. Abdomen: Soft, non-tender, non-distended, BS + x 4.  Extremities: No clubbing, cyanosis or edema. DP/PT/Radials 2+ and equal bilaterally.  Accessory Clinical Findings  Echo/ CT are reviewed  Assessment & Plan

## 2012-07-14 NOTE — Assessment & Plan Note (Signed)
No changes at this time.  She is euvolemic on exam. CT recently negative for PTE and Echo is normal. We will reassess prior to her delivery

## 2012-07-14 NOTE — Assessment & Plan Note (Signed)
Ongoing sinus tachycardia Increase labetalol to 200mg  BID at this time No other changes at this time. Labetalol can be increased if necessary

## 2012-07-28 ENCOUNTER — Other Ambulatory Visit: Payer: Self-pay | Admitting: *Deleted

## 2012-07-28 MED ORDER — LABETALOL HCL 100 MG PO TABS: 200.0000 mg | ORAL_TABLET | Freq: Two times a day (BID) | ORAL | Status: DC

## 2012-08-18 ENCOUNTER — Telehealth: Payer: Self-pay | Admitting: Internal Medicine

## 2012-08-18 NOTE — Telephone Encounter (Signed)
Please return call to patient at [phone], pt would not disclose information

## 2012-08-18 NOTE — Telephone Encounter (Signed)
Tried to reassure patient.  She is going to have the baby in 7 weeks and is just very stressed

## 2012-09-08 ENCOUNTER — Inpatient Hospital Stay (HOSPITAL_COMMUNITY): Payer: BC Managed Care – PPO

## 2012-09-08 ENCOUNTER — Inpatient Hospital Stay (HOSPITAL_COMMUNITY)
Admission: AD | Admit: 2012-09-08 | Discharge: 2012-09-10 | DRG: 379 | Disposition: A | Discharge status: Home / Self Care | Payer: BC Managed Care – PPO | Source: Ambulatory Visit | Attending: Obstetrics and Gynecology | Admitting: Obstetrics and Gynecology

## 2012-09-08 ENCOUNTER — Encounter (HOSPITAL_COMMUNITY): Payer: Self-pay

## 2012-09-08 DIAGNOSIS — R109 Unspecified abdominal pain: Secondary | ICD-10-CM | POA: Diagnosis present

## 2012-09-08 DIAGNOSIS — O4700 False labor before 37 completed weeks of gestation, unspecified trimester: Principal | ICD-10-CM | POA: Diagnosis present

## 2012-09-08 LAB — CBC
MCV: 87.9 fL (ref 78.0–100.0)
Platelets: 132 10*3/uL — ABNORMAL LOW (ref 150–400)
RBC: 4.39 MIL/uL (ref 3.87–5.11)
RDW: 13.2 % (ref 11.5–15.5)
WBC: 12 10*3/uL — ABNORMAL HIGH (ref 4.0–10.5)

## 2012-09-08 MED ORDER — MAGNESIUM SULFATE 40 G IN LACTATED RINGERS - SIMPLE
1.0000 g/h | INTRAVENOUS | Status: AC
  Administered 2012-09-09: 2 g/h via INTRAVENOUS
  Filled 2012-09-08: qty 500

## 2012-09-08 MED ORDER — MAGNESIUM SULFATE BOLUS VIA INFUSION
4.0000 g | Freq: Once | INTRAVENOUS | Status: AC
  Administered 2012-09-09: 4 g via INTRAVENOUS
  Filled 2012-09-08: qty 500

## 2012-09-08 MED ORDER — BUTORPHANOL TARTRATE 1 MG/ML IJ SOLN
2.0000 mg | Freq: Once | INTRAMUSCULAR | Status: AC
  Administered 2012-09-09: 2 mg via INTRAVENOUS
  Filled 2012-09-08: qty 1
  Filled 2012-09-08: qty 2

## 2012-09-08 MED ORDER — TERBUTALINE SULFATE 1 MG/ML IJ SOLN
0.2500 mg | Freq: Once | INTRAMUSCULAR | Status: AC
  Administered 2012-09-08: 0.25 mg via SUBCUTANEOUS
  Filled 2012-09-08: qty 1

## 2012-09-08 MED ORDER — SODIUM CHLORIDE 0.9 % IV SOLN
3.0000 g | Freq: Once | INTRAVENOUS | Status: AC
  Administered 2012-09-08: 3 g via INTRAVENOUS
  Filled 2012-09-08: qty 3

## 2012-09-08 MED ORDER — LACTATED RINGERS IV SOLN
INTRAVENOUS | Status: DC
  Administered 2012-09-09: 06:00:00 via INTRAVENOUS

## 2012-09-08 MED ORDER — LACTATED RINGERS IV BOLUS (SEPSIS)
1000.0000 mL | Freq: Once | INTRAVENOUS | Status: AC
  Administered 2012-09-08: 1000 mL via INTRAVENOUS

## 2012-09-08 MED ORDER — ZOLPIDEM TARTRATE 5 MG PO TABS
5.0000 mg | ORAL_TABLET | Freq: Every evening | ORAL | Status: DC | PRN
  Administered 2012-09-10: 5 mg via ORAL
  Filled 2012-09-08: qty 1

## 2012-09-08 MED ORDER — CALCIUM CARBONATE ANTACID 500 MG PO CHEW: 2.0000 | CHEWABLE_TABLET | ORAL | Status: DC | PRN

## 2012-09-08 MED ORDER — PRENATAL MULTIVITAMIN CH
1.0000 | ORAL_TABLET | Freq: Every day | ORAL | Status: DC
  Administered 2012-09-09: 1 via ORAL
  Filled 2012-09-08: qty 1

## 2012-09-08 MED ORDER — BETAMETHASONE SOD PHOS & ACET 6 (3-3) MG/ML IJ SUSP
12.0000 mg | INTRAMUSCULAR | Status: AC
  Administered 2012-09-08 – 2012-09-09 (×2): 12 mg via INTRAMUSCULAR
  Filled 2012-09-08 (×2): qty 2

## 2012-09-08 MED ORDER — DOCUSATE SODIUM 100 MG PO CAPS
100.0000 mg | ORAL_CAPSULE | Freq: Every day | ORAL | Status: DC
  Administered 2012-09-09: 100 mg via ORAL
  Filled 2012-09-08: qty 1

## 2012-09-08 MED ORDER — ACETAMINOPHEN 325 MG PO TABS
650.0000 mg | ORAL_TABLET | ORAL | Status: DC | PRN
  Administered 2012-09-09: 650 mg via ORAL
  Filled 2012-09-08: qty 2

## 2012-09-08 NOTE — MAU Note (Signed)
Patient states she was seen in the office today and sent to MAU for evaluation. Has had a cold for about 2 weeks ago that is getting worse. Started having abdominal pain about 0830 that is not getting any better. Abdomen tender to touch. Denies any bleeding or leaking fluid. Reports good fetal movement.

## 2012-09-08 NOTE — H&P (Signed)
41 year old G 3 P 2 at 42 6/7 weeks presented to the office earlier this afternoon complaining of not feeling well and having tightening/pain in lower abdomen. Denies fever but has had nausea. Recently on Ceftin for URI. Denies any abnormal vaginal discharge. In the office her cervix was long and closed. PNC complicated by tachycardia  Afebrile Vital signs stable General alert and oriented Lung CTAB Car RRR Abd is soft no rebound or guarding Uterus is mildly tender at the fundus  Labs WBC is 12 Platelets 132,000  Ultrasound is normal AFI and growth  Fetal heart rate is reassuring TOCO Shows uterine irritability unchanged after terbutaline  Impression Iup at 33 6/7 Abdominal pain of unclear etiology  Plan Place in  observation Repeat CBC in am Monitor for signs of chorio or other potential cause for pain such as appendicitis

## 2012-09-08 NOTE — MAU Provider Note (Signed)
History     CSN: [id number]  Arrival date and time: 09/08/12 1614   First Provider Initiated Contact with Patient 09/08/12 1840      Chief Complaint  Patient presents with  . Abdominal Pain  . URI   HPI Jacarra Bobak 41 y.o. [redacted]w[redacted]d Began not feeling well midmorning today and was seen in the office.  Was having uterine tightness constantly and worse with contractions.  Uterus is much more tender than yesterday.  Having contractions.  Having nausea with loss of appetite.  Sent from the office to MAU for further evaluation.  OB History    Grav Para Term Preterm Abortions TAB SAB Ect Mult Living   3 2 2       2       Past Medical History  Diagnosis Date  . Inappropriate sinus tachycardia   . Seasonal allergies   . Palpitations   . Fatigue     Past Surgical History  Procedure Date  . Wisdom tooth extraction     Family History  Problem Relation Age of Onset  . Heart attack Father   . Diabetes Brother   . Heart attack Maternal Grandfather   . Diabetes Father   . Hypertension Father   . Hypertension Brother     History  Substance Use Topics  . Smoking status: Never Smoker   . Smokeless tobacco: Never Used  . Alcohol Use: No    Allergies:  Allergies  Allergen Reactions  . Cheese Anaphylaxis  . Avelox (Moxifloxacin Hcl In Nacl) Hives  . Toprol Xl (Metoprolol Succinate) Hives  . Zithromax (Azithromycin) Hives    Prescriptions prior to admission  Medication Sig Dispense Refill  . acetaminophen (TYLENOL) 500 MG tablet Take 500 mg by mouth every 6 (six) hours as needed. headache      . cefUROXime (CEFTIN) 250 MG tablet Take 250 mg by mouth 2 (two) times daily. headcold      . cetirizine (ZYRTEC) 10 MG tablet Take 10 mg by mouth daily.      Marland Kitchen labetalol (NORMODYNE) 100 MG tablet Take 2 tablets (200 mg total) by mouth 2 (two) times daily.  360 tablet  1  . ranitidine (ZANTAC) 150 MG tablet Take 150 mg by mouth 4 (four) times daily.        Review of Systems    Constitutional: Negative for fever.  Gastrointestinal: Positive for nausea and abdominal pain. Negative for vomiting, diarrhea and constipation.  Genitourinary:       Dampness in underwear - worse in past 2 weeks   Physical Exam   Blood pressure 136/79, pulse 92, temperature 98.6 F (37 C), temperature source Oral, resp. rate 18, height 5\' 7"  (1.702 m), weight 78.472 kg (173 lb), last menstrual period 01/19/2012, SpO2 100.00%.  Physical Exam  Nursing note and vitals reviewed. Constitutional: She is oriented to person, place, and time. She appears well-developed and well-nourished. No distress.  HENT:  Head: Normocephalic.  Eyes: EOM are normal.  Neck: Neck supple.  GI: There is tenderness. There is no rebound and no guarding.       Uterus has some tenseness and has tenderness on palpation. FHT baseline 145.  Reactive strip Irregular contractions and uterine irritability noted  Musculoskeletal: Normal range of motion.  Neurological: She is alert and oriented to person, place, and time.  Skin: Skin is warm and dry.  Psychiatric: She has a normal mood and affect.    MAU Course  Procedures  MDM Dr Vincente Poli saw client  earlier in MAU 1900  Consult with Dr. Vincente Poli re: plan of care 2020   To ultrasound.  Still having abdominal tightness and complains of pain.  Assessment and Plan  Care assumed D. Minervia Osso, CNM at 2020  [facility] 09/08/2012, 7:01 PM   2120 Dr. Vincente Poli called for status report but pt still in [place].  2230: Abd pain not improved after terbutaline, IV abx, IVF and rest. Hungry.  [place] all WNL with CL 3.3, uterine irritability and reactive FHR. Discussed with Dr. Vincente Poli: admit for observation, continue IV hydration, regular diet, Ambien and prn analgesia, recheck CBC in AM. Pt informed. Danae Orleans, CNM 09/08/2012 10:35 PM

## 2012-09-09 LAB — URINALYSIS, ROUTINE W REFLEX MICROSCOPIC
Glucose, UA: NEGATIVE mg/dL
Leukocytes, UA: NEGATIVE
Protein, ur: NEGATIVE mg/dL
Specific Gravity, Urine: 1.015 (ref 1.005–1.030)

## 2012-09-09 LAB — CBC
Hemoglobin: 12.9 g/dL (ref 12.0–15.0)
MCHC: 33.6 g/dL (ref 30.0–36.0)
RBC: 4.26 MIL/uL (ref 3.87–5.11)
WBC: 14.1 10*3/uL — ABNORMAL HIGH (ref 4.0–10.5)

## 2012-09-09 MED ORDER — FAMOTIDINE 20 MG PO TABS
20.0000 mg | ORAL_TABLET | Freq: Two times a day (BID) | ORAL | Status: DC
  Administered 2012-09-09 (×2): 20 mg via ORAL
  Filled 2012-09-09 (×2): qty 1

## 2012-09-09 MED ORDER — FAMOTIDINE IN NACL 20-0.9 MG/50ML-% IV SOLN
20.0000 mg | Freq: Two times a day (BID) | INTRAVENOUS | Status: DC
  Filled 2012-09-09: qty 50

## 2012-09-09 MED ORDER — LABETALOL HCL 200 MG PO TABS
200.0000 mg | ORAL_TABLET | Freq: Two times a day (BID) | ORAL | Status: DC
  Administered 2012-09-09 (×3): 200 mg via ORAL
  Filled 2012-09-09 (×4): qty 1

## 2012-09-09 MED ORDER — CEFUROXIME AXETIL 250 MG PO TABS
250.0000 mg | ORAL_TABLET | Freq: Two times a day (BID) | ORAL | Status: DC
  Administered 2012-09-09 (×2): 250 mg via ORAL
  Filled 2012-09-09 (×3): qty 1

## 2012-09-09 MED ORDER — SODIUM CHLORIDE 0.9 % IJ SOLN
3.0000 mL | Freq: Two times a day (BID) | INTRAMUSCULAR | Status: DC
  Administered 2012-09-09: 3 mL via INTRAVENOUS

## 2012-09-09 MED ORDER — LABETALOL HCL 200 MG PO TABS: 200.0000 mg | ORAL_TABLET | Freq: Two times a day (BID) | ORAL | Status: DC

## 2012-09-09 NOTE — Progress Notes (Signed)
Pt feeling much better this evening.  No vb or lof.  Good fm.  Reports rare ctx.  FHT reassuring w/ accels Toco rare Cvx deferred  A/P:  Preterm ctx Plan to d/c mag tonight w/ 2nd BMZ

## 2012-09-09 NOTE — Progress Notes (Signed)
Pt feeling some abdominal tightness but reports feeling somewhat better than yesterday.  No VB or LOF.  Good FM.  C/o reflux  AF, VSS  HR 111-115 Gen - NAD Abd - gravid, NT CV - regular rhythm, tachy Lungs - clear PV - deferred  CBC - WBC 14, plts  A/P:  Preterm contractions Continue mag x 24hrs, ween to 1gm/hr today Pepcid bid BMZ #2 today May shower/eat breakfast

## 2012-09-09 NOTE — Progress Notes (Signed)
UR Chart review completed.  

## 2012-09-10 MED ORDER — NIFEDIPINE 10 MG PO CAPS: 10.0000 mg | ORAL_CAPSULE | Freq: Four times a day (QID) | ORAL | Status: DC | PRN

## 2012-09-10 NOTE — Discharge Summary (Signed)
Physician Discharge Summary  Patient ID: [name] MRN: [id number] DOB/AGE: 04/07/1971 41 y.o.  Admit date: 09/08/2012 Discharge date: 09/10/2012  Admission Diagnoses:Abdominal Pain   Discharge Diagnoses: Preterm Labor Active Problems:  * No active hospital problems. *    Discharged Condition: good  Hospital Course: Admitted for observation. UCs noted and started on Magnesium Sulfate. UCs stopped.  Cervix is closed and thick prior to discharge.  Consults: None  Significant Diagnostic Studies: none  Treatments: IV hydration and magnesium sulfate  Discharge Exam: Blood pressure 118/68, pulse 105, temperature 98.7 F (37.1 C), temperature source Oral, resp. rate 18, height 5\' 8"  (1.727 m), weight 78.019 kg (172 lb), last menstrual period 01/19/2012, SpO2 99.00%. General appearance: alert and cooperative Pelvic: Cx closed and thick  Disposition: 01-Home or Self Care  Discharge Orders    Future Appointments: Provider: Department: Dept Phone: Center:   09/15/2012 10:15 AM Hillis Range, MD [facility] [phone] [facility]   11/15/2012 10:00 AM Hillis Range, MD [facility] [phone] [facility]       Medication List     As of 09/10/2012  9:14 AM    STOP taking these medications         cefUROXime 250 MG tablet   Commonly known as: CEFTIN      TAKE these medications         acetaminophen 500 MG tablet   Commonly known as: TYLENOL   Take 500 mg by mouth every 6 (six) hours as needed. headache      cetirizine 10 MG tablet   Commonly known as: ZYRTEC   Take 10 mg by mouth daily.      labetalol 100 MG tablet   Commonly known as: NORMODYNE   Take 2 tablets (200 mg total) by mouth 2 (two) times daily.      ranitidine 150 MG tablet   Commonly known as: ZANTAC   Take 150 mg by mouth 4 (four) times daily.           Follow-up Information    In 3 days to follow up.         Signed: Retta Mac E 09/10/2012, 9:14 AM

## 2012-09-10 NOTE — Progress Notes (Signed)
Feels good, tolerating breakfast  Blood pressure 118/68, pulse 105, temperature 98.7 F (37.1 C), temperature source Oral, resp. rate 18, height 5\' 8"  (1.727 m), weight 78.019 kg (172 lb), last menstrual period 01/19/2012, SpO2 99.00%.  Abd soft, NT, normal BS Cx-cl/th-high  Will monitor for NST this am  A: PT UCs resolved  P: if NST reactive-D/C home     PR, rest on side encouraged , fluids     FU Monday with NST as scheduled

## 2012-09-11 LAB — CULTURE, BETA STREP (GROUP B ONLY)

## 2012-09-11 LAB — OB RESULTS CONSOLE GBS: GBS: NEGATIVE

## 2012-09-15 ENCOUNTER — Encounter: Payer: Self-pay | Admitting: Internal Medicine

## 2012-09-15 ENCOUNTER — Ambulatory Visit (INDEPENDENT_AMBULATORY_CARE_PROVIDER_SITE_OTHER): Payer: BC Managed Care – PPO | Admitting: Internal Medicine

## 2012-09-15 VITALS — BP 104/72 | HR 130 | Ht 68.0 in | Wt 170.8 lb

## 2012-09-15 DIAGNOSIS — R000 Tachycardia, unspecified: Secondary | ICD-10-CM

## 2012-09-15 DIAGNOSIS — R0989 Other specified symptoms and signs involving the circulatory and respiratory systems: Secondary | ICD-10-CM

## 2012-09-15 NOTE — Assessment & Plan Note (Signed)
Stable No changes at this time.  She is euvolemic on exam. CT recently negative for PTE and Echo is normal. We will reassess after her delivery. I would anticipate that she will be able to proceed with vaginal delivery unless other issues arise.

## 2012-09-15 NOTE — Patient Instructions (Addendum)
Your physician recommends that you schedule a follow-up appointment in: 6 weeks with Dr Allred  

## 2012-09-15 NOTE — Assessment & Plan Note (Signed)
Ongoing sinus tachycardia No changes at this time.  We will switch to atenolol after delivery unless she breast feeds.  This will ultimately be determined by her symptoms

## 2012-09-15 NOTE — Progress Notes (Signed)
  Patient Name: [name] Date of Encounter: 09/15/2012  Patient Profile  41 year old female with history of inappropriate sinus tachycardia who presents for followup.  Problem List   Past Medical History  Diagnosis Date  . Inappropriate sinus tachycardia   . Seasonal allergies   . Palpitations   . Fatigue    Past Surgical History  Procedure Date  . Wisdom tooth extraction     Allergies  Allergies  Allergen Reactions  . Cheese Anaphylaxis  . Avelox (Moxifloxacin Hcl In Nacl) Hives  . Toprol Xl (Metoprolol Succinate) Hives  . Zithromax (Azithromycin) Hives    HPI  [name] today presents for follow-up.  Since that time, she continues to have sinus tachycardia with associated SOB with moderate activity.  This has not improved with titration of labetalol.  Further uptitration has been limited by symptoms of hypotension.  She is presently [redacted] weeks pregnant.  Prior workup including chest CT and echo were unrevealing.   She denies any chest pain, PND, orthopnea, dizziness, syncope, nausea, vomiting, edema, or early satiety.    Home Medications  Prior to Admission medications   Medication Sig Start Date End Date Taking? Authorizing Provider  cetirizine (ZYRTEC) 10 MG tablet Take 10 mg by mouth daily.   Yes Historical Provider, MD  ibuprofen (ADVIL,MOTRIN) 200 MG tablet Take 400 mg by mouth every 6 (six) hours as needed. Head aches   Yes Historical Provider, MD  labetalol (NORMODYNE) 100 MG tablet Take 100 mg by mouth 2 (two) times daily.   Yes Historical Provider, MD    Physical Exam  Blood pressure 104/72, pulse 130, height 5\' 8"  (1.727 m), weight 170 lb 12.8 oz (77.474 kg), last menstrual period 01/19/2012, SpO2 99.00%.  General: Pleasant, NAD Psych: Normal affect. Neuro: Alert and oriented X 3. Moves all extremities spontaneously. HEENT: Normal  Neck: Supple without bruits or JVD. Lungs:  Resp regular and unlabored, CTA. Heart: RRR no s3, s4, or murmurs. Abdomen:  Soft, non-tender, non-distended, BS + x 4.  Extremities: No clubbing, cyanosis or edema. DP/PT/Radials 2+ and equal bilaterally.  Accessory Clinical Findings  Echo/ CT are reviewed again today  Assessment & Plan

## 2012-09-28 ENCOUNTER — Encounter (HOSPITAL_COMMUNITY): Payer: Self-pay | Admitting: Anesthesiology

## 2012-09-28 ENCOUNTER — Inpatient Hospital Stay (HOSPITAL_COMMUNITY)
Admission: AD | Admit: 2012-09-28 | Discharge: 2012-10-01 | DRG: 373 | Disposition: A | Discharge status: Home / Self Care | Payer: BC Managed Care – PPO | Source: Ambulatory Visit | Attending: Obstetrics and Gynecology | Admitting: Obstetrics and Gynecology

## 2012-09-28 ENCOUNTER — Inpatient Hospital Stay (HOSPITAL_COMMUNITY): Payer: BC Managed Care – PPO | Admitting: Anesthesiology

## 2012-09-28 ENCOUNTER — Encounter (HOSPITAL_COMMUNITY): Payer: Self-pay

## 2012-09-28 DIAGNOSIS — O99892 Other specified diseases and conditions complicating childbirth: Secondary | ICD-10-CM | POA: Diagnosis present

## 2012-09-28 DIAGNOSIS — I471 Supraventricular tachycardia, unspecified: Secondary | ICD-10-CM | POA: Diagnosis present

## 2012-09-28 DIAGNOSIS — I498 Other specified cardiac arrhythmias: Secondary | ICD-10-CM | POA: Diagnosis present

## 2012-09-28 DIAGNOSIS — R000 Tachycardia, unspecified: Secondary | ICD-10-CM

## 2012-09-28 DIAGNOSIS — R5383 Other fatigue: Secondary | ICD-10-CM

## 2012-09-28 DIAGNOSIS — O09529 Supervision of elderly multigravida, unspecified trimester: Secondary | ICD-10-CM | POA: Diagnosis present

## 2012-09-28 LAB — CBC
HCT: 37.4 % (ref 36.0–46.0)
MCH: 30.6 pg (ref 26.0–34.0)
MCHC: 34.5 g/dL (ref 30.0–36.0)
RDW: 13.2 % (ref 11.5–15.5)

## 2012-09-28 MED ORDER — EPHEDRINE 5 MG/ML INJ
10.0000 mg | INTRAVENOUS | Status: DC | PRN
  Filled 2012-09-28: qty 4

## 2012-09-28 MED ORDER — FENTANYL 2.5 MCG/ML BUPIVACAINE 1/10 % EPIDURAL INFUSION (WH - ANES)
INTRAMUSCULAR | Status: DC | PRN
  Administered 2012-09-28: 15 mL/h via EPIDURAL

## 2012-09-28 MED ORDER — PHENYLEPHRINE 40 MCG/ML (10ML) SYRINGE FOR IV PUSH (FOR BLOOD PRESSURE SUPPORT)
80.0000 ug | PREFILLED_SYRINGE | INTRAVENOUS | Status: DC | PRN
  Filled 2012-09-28: qty 10

## 2012-09-28 MED ORDER — SODIUM CHLORIDE 0.9 % IV SOLN
1.0000 g | Freq: Four times a day (QID) | INTRAVENOUS | Status: DC
  Administered 2012-09-28 (×2): 1 g via INTRAVENOUS
  Filled 2012-09-28 (×4): qty 1000

## 2012-09-28 MED ORDER — LACTATED RINGERS IV SOLN: 500.0000 mL | Freq: Once | INTRAVENOUS | Status: DC

## 2012-09-28 MED ORDER — CITRIC ACID-SODIUM CITRATE 334-500 MG/5ML PO SOLN: 30.0000 mL | ORAL | Status: DC | PRN

## 2012-09-28 MED ORDER — IBUPROFEN 600 MG PO TABS: 600.0000 mg | ORAL_TABLET | Freq: Four times a day (QID) | ORAL | Status: DC | PRN

## 2012-09-28 MED ORDER — ONDANSETRON HCL 4 MG/2ML IJ SOLN: 4.0000 mg | Freq: Four times a day (QID) | INTRAMUSCULAR | Status: DC | PRN

## 2012-09-28 MED ORDER — PHENYLEPHRINE 40 MCG/ML (10ML) SYRINGE FOR IV PUSH (FOR BLOOD PRESSURE SUPPORT): 80.0000 ug | PREFILLED_SYRINGE | INTRAVENOUS | Status: DC | PRN

## 2012-09-28 MED ORDER — TERBUTALINE SULFATE 1 MG/ML IJ SOLN: 0.2500 mg | Freq: Once | INTRAMUSCULAR | Status: AC | PRN

## 2012-09-28 MED ORDER — FENTANYL 2.5 MCG/ML BUPIVACAINE 1/10 % EPIDURAL INFUSION (WH - ANES)
14.0000 mL/h | INTRAMUSCULAR | Status: DC
  Filled 2012-09-28: qty 123

## 2012-09-28 MED ORDER — OXYTOCIN BOLUS FROM INFUSION
500.0000 mL | Freq: Once | INTRAVENOUS | Status: DC
  Filled 2012-09-28: qty 500

## 2012-09-28 MED ORDER — OXYCODONE-ACETAMINOPHEN 5-325 MG PO TABS: 1.0000 | ORAL_TABLET | ORAL | Status: DC | PRN

## 2012-09-28 MED ORDER — LACTATED RINGERS IV SOLN: 500.0000 mL | INTRAVENOUS | Status: DC | PRN

## 2012-09-28 MED ORDER — ACETAMINOPHEN 325 MG PO TABS: 650.0000 mg | ORAL_TABLET | ORAL | Status: DC | PRN

## 2012-09-28 MED ORDER — LABETALOL HCL 200 MG PO TABS
200.0000 mg | ORAL_TABLET | Freq: Two times a day (BID) | ORAL | Status: DC
  Administered 2012-09-28: 200 mg via ORAL
  Filled 2012-09-28 (×2): qty 1

## 2012-09-28 MED ORDER — BUTORPHANOL TARTRATE 1 MG/ML IJ SOLN: 1.0000 mg | INTRAMUSCULAR | Status: DC | PRN

## 2012-09-28 MED ORDER — FAMOTIDINE 20 MG PO TABS
40.0000 mg | ORAL_TABLET | Freq: Two times a day (BID) | ORAL | Status: DC
  Administered 2012-09-28: 40 mg via ORAL
  Filled 2012-09-28: qty 2

## 2012-09-28 MED ORDER — OXYTOCIN 40 UNITS IN LACTATED RINGERS INFUSION - SIMPLE MED
62.5000 mL/h | Freq: Once | INTRAVENOUS | Status: AC
  Administered 2012-09-29: 62.5 mL/h via INTRAVENOUS

## 2012-09-28 MED ORDER — LIDOCAINE HCL (PF) 1 % IJ SOLN: 30.0000 mL | INTRAMUSCULAR | Status: DC | PRN

## 2012-09-28 MED ORDER — LACTATED RINGERS IV SOLN
INTRAVENOUS | Status: DC
  Administered 2012-09-29: 02:00:00 via INTRAVENOUS

## 2012-09-28 MED ORDER — DIPHENHYDRAMINE HCL 50 MG/ML IJ SOLN
12.5000 mg | INTRAMUSCULAR | Status: DC | PRN
  Administered 2012-09-29: 12.5 mg via INTRAVENOUS
  Filled 2012-09-28: qty 1

## 2012-09-28 MED ORDER — EPHEDRINE 5 MG/ML INJ: 10.0000 mg | INTRAVENOUS | Status: DC | PRN

## 2012-09-28 MED ORDER — OXYTOCIN 40 UNITS IN LACTATED RINGERS INFUSION - SIMPLE MED
1.0000 m[IU]/min | INTRAVENOUS | Status: DC
  Administered 2012-09-28: 2 m[IU]/min via INTRAVENOUS
  Filled 2012-09-28: qty 1000

## 2012-09-28 MED ORDER — LIDOCAINE HCL (PF) 1 % IJ SOLN
INTRAMUSCULAR | Status: DC | PRN
  Administered 2012-09-28: 4 mL
  Administered 2012-09-28: 5 mL

## 2012-09-28 NOTE — MAU Note (Signed)
Pt states was sent from MD office for labor eval, cervix was 2-3/50. Bleeding post exam, denies lof. Contractions q5 minutes apart.

## 2012-09-28 NOTE — H&P (Signed)
[name] is a 41 y.o. female presenting for labor.  She began with regular ctxs this afternoon and now are 2-3 minutes apart and hurting.  She has a SVT/PAT and is followed by Dr Johney Frame , cardiologist, and is on labetalol with some control of sxs.  In last 4-6 weeks, has been on bedrest due to paliptations and svt with any activity.  Now with labor sxs, patients reports worsening of sxs and wants epidural.GBS -. History OB History    Grav Para Term Preterm Abortions TAB SAB Ect Mult Living   3 2 2       2      Past Medical History  Diagnosis Date  . Inappropriate sinus tachycardia   . Seasonal allergies   . Palpitations   . Fatigue    Past Surgical History  Procedure Date  . Wisdom tooth extraction    Family History: family history includes Diabetes in her brother and father; Heart attack in her father and maternal grandfather; and Hypertension in her brother and father. Social History:  reports that she has never smoked. She has never used smokeless tobacco. She reports that she does not drink alcohol or use illicit drugs.   Prenatal Transfer Tool  Maternal Diabetes: No Genetic Screening: Normal Maternal Ultrasounds/Referrals: Abnormal:  Findings:   Other: possible early IUGR Fetal Ultrasounds or other Referrals:  None Maternal Substance Abuse:  No Significant Maternal Medications:  Meds include: Other: labetalol Significant Maternal Lab Results:  None Other Comments:  None  ROS  Cx:3/75/-2 DTRs 1/4 Blood pressure 137/71, pulse 95, temperature 97.2 F (36.2 C), temperature source Oral, resp. rate 16, height 5\' 8"  (1.727 m), weight 78.642 kg (173 lb 6 oz), last menstrual period 01/19/2012. Exam Physical Exam  Prenatal labs: ABO, Rh: --/--/A NEG (02/23 2201) Antibody: NEG (02/23 2201) Rubella:   RPR: Nonreactive (03/26 0000)  HBsAg: Negative (03/26 0000)  HIV: Non-reactive (03/26 0000)  GBS:     Assessment/Plan: IUP at 36 5/7 now in labor.  Cervical change since this  afternoon.  Patient requesting epidural SVT/PAT - continue labetalol.  Consider maternal telemetry if sxs or tachycardia.  Cardiologist did state shes ok to deliver vaginally. Because preterm (by 2 days), plan IV abx. Anticipate SVD Kerstie Agent C 09/28/2012, 5:52 PM

## 2012-09-28 NOTE — Anesthesia Procedure Notes (Signed)
Epidural Patient location during procedure: OB Start time: 09/28/2012 9:54 PM  Staffing Anesthesiologist: Michaelangelo Mittelman A. Performed by: anesthesiologist   Preanesthetic Checklist Completed: patient identified, site marked, surgical consent, pre-op evaluation, timeout performed, IV checked, risks and benefits discussed and monitors and equipment checked  Epidural Patient position: sitting Prep: site prepped and draped and DuraPrep Patient monitoring: continuous pulse ox and blood pressure Approach: midline Injection technique: LOR air  Needle:  Needle type: Tuohy  Needle gauge: 17 G Needle length: 9 cm and 9 Needle insertion depth: 5 cm cm Catheter type: closed end flexible Catheter size: 19 Gauge Catheter at skin depth: 10 cm Test dose: negative and Other  Assessment Events: blood not aspirated, injection not painful, no injection resistance, negative IV test and no paresthesia  Additional Notes Patient identified. Risks and benefits discussed including failed block, incomplete  Pain control, post dural puncture headache, nerve damage, paralysis, blood pressure Changes, nausea, vomiting, reactions to medications-both toxic and allergic and post Partum back pain. All questions were answered. Patient expressed understanding and wished to proceed. Sterile technique was used throughout procedure. Epidural site was Dressed with sterile barrier dressing. No paresthesias, signs of intravascular injection Or signs of intrathecal spread were encountered.  Patient was more comfortable after the epidural was dosed. Please see RN's note for documentation of vital signs and FHR which are stable.

## 2012-09-28 NOTE — Anesthesia Preprocedure Evaluation (Signed)
Anesthesia Evaluation  Patient identified by MRN, date of birth, ID band Patient awake    Reviewed: Allergy & Precautions, H&P , Patient's Chart, lab work & pertinent test results, reviewed documented beta blocker date and time   Airway Mallampati: II TM Distance: >3 FB Neck ROM: full    Dental No notable dental hx. (+) Teeth Intact   Pulmonary shortness of breath and with exertion,  breath sounds clear to auscultation  Pulmonary exam normal       Cardiovascular + dysrhythmias Supra Ventricular Tachycardia Rhythm:regular Rate:Normal     Neuro/Psych negative neurological ROS  negative psych ROS   GI/Hepatic Neg liver ROS, GERD-  Medicated and Controlled,  Endo/Other  negative endocrine ROS  Renal/GU negative Renal ROS  negative genitourinary   Musculoskeletal   Abdominal Normal abdominal exam  (+)   Peds  Hematology negative hematology ROS (+)   Anesthesia Other Findings   Reproductive/Obstetrics                           Anesthesia Physical Anesthesia Plan  ASA: II  Anesthesia Plan: Epidural   Post-op Pain Management:    Induction:   Airway Management Planned:   Additional Equipment:   Intra-op Plan:   Post-operative Plan:   Informed Consent: I have reviewed the patients History and Physical, chart, labs and discussed the procedure including the risks, benefits and alternatives for the proposed anesthesia with the patient or authorized representative who has indicated his/her understanding and acceptance.   Dental Advisory Given  Plan Discussed with: Anesthesiologist  Anesthesia Plan Comments:         Anesthesia Quick Evaluation

## 2012-09-28 NOTE — Progress Notes (Signed)
Dr Rana Snare notified of patient (she is requesting him to come see her), her c/o of ctx q54m since noon. He is aware of her tracing, ctx pattern. He will come to MAU to see patient.

## 2012-09-29 ENCOUNTER — Encounter (HOSPITAL_COMMUNITY): Payer: Self-pay

## 2012-09-29 MED ORDER — OXYCODONE-ACETAMINOPHEN 5-325 MG PO TABS: 1.0000 | ORAL_TABLET | ORAL | Status: DC | PRN

## 2012-09-29 MED ORDER — ZOLPIDEM TARTRATE 5 MG PO TABS: 5.0000 mg | ORAL_TABLET | Freq: Every evening | ORAL | Status: DC | PRN

## 2012-09-29 MED ORDER — MEASLES, MUMPS & RUBELLA VAC ~~LOC~~ INJ
0.5000 mL | INJECTION | Freq: Once | SUBCUTANEOUS | Status: DC
  Filled 2012-09-29: qty 0.5

## 2012-09-29 MED ORDER — ONDANSETRON HCL 4 MG PO TABS: 4.0000 mg | ORAL_TABLET | ORAL | Status: DC | PRN

## 2012-09-29 MED ORDER — MEDROXYPROGESTERONE ACETATE 150 MG/ML IM SUSP
150.0000 mg | INTRAMUSCULAR | Status: AC | PRN
  Administered 2012-09-30: 150 mg via INTRAMUSCULAR
  Filled 2012-09-29: qty 1

## 2012-09-29 MED ORDER — SIMETHICONE 80 MG PO CHEW: 80.0000 mg | CHEWABLE_TABLET | ORAL | Status: DC | PRN

## 2012-09-29 MED ORDER — HYDROCORTISONE 1 % EX CREA
TOPICAL_CREAM | Freq: Two times a day (BID) | CUTANEOUS | Status: DC
  Administered 2012-09-29 – 2012-10-01 (×5): via TOPICAL
  Filled 2012-09-29: qty 28

## 2012-09-29 MED ORDER — FAMOTIDINE 20 MG PO TABS
20.0000 mg | ORAL_TABLET | Freq: Two times a day (BID) | ORAL | Status: DC
  Administered 2012-09-29 – 2012-10-01 (×5): 20 mg via ORAL
  Filled 2012-09-29 (×5): qty 1

## 2012-09-29 MED ORDER — ONDANSETRON HCL 4 MG/2ML IJ SOLN: 4.0000 mg | INTRAMUSCULAR | Status: DC | PRN

## 2012-09-29 MED ORDER — DIPHENHYDRAMINE HCL 25 MG PO CAPS: 25.0000 mg | ORAL_CAPSULE | Freq: Four times a day (QID) | ORAL | Status: DC | PRN

## 2012-09-29 MED ORDER — TETANUS-DIPHTH-ACELL PERTUSSIS 5-2.5-18.5 LF-MCG/0.5 IM SUSP: 0.5000 mL | Freq: Once | INTRAMUSCULAR | Status: DC

## 2012-09-29 MED ORDER — WITCH HAZEL-GLYCERIN EX PADS: 1.0000 "application " | MEDICATED_PAD | CUTANEOUS | Status: DC | PRN

## 2012-09-29 MED ORDER — PRENATAL MULTIVITAMIN CH
1.0000 | ORAL_TABLET | Freq: Every day | ORAL | Status: DC
  Administered 2012-09-30 – 2012-10-01 (×2): 1 via ORAL
  Filled 2012-09-29 (×2): qty 1

## 2012-09-29 MED ORDER — IBUPROFEN 600 MG PO TABS
600.0000 mg | ORAL_TABLET | Freq: Four times a day (QID) | ORAL | Status: DC
  Administered 2012-09-29 – 2012-10-01 (×10): 600 mg via ORAL
  Filled 2012-09-29 (×11): qty 1

## 2012-09-29 MED ORDER — INFLUENZA VIRUS VACC SPLIT PF IM SUSP
0.5000 mL | INTRAMUSCULAR | Status: AC
  Administered 2012-09-30: 0.5 mL via INTRAMUSCULAR
  Filled 2012-09-29: qty 0.5

## 2012-09-29 MED ORDER — SENNOSIDES-DOCUSATE SODIUM 8.6-50 MG PO TABS
2.0000 | ORAL_TABLET | Freq: Every day | ORAL | Status: DC
  Administered 2012-09-29 – 2012-09-30 (×2): 2 via ORAL

## 2012-09-29 MED ORDER — LORATADINE 10 MG PO TABS
10.0000 mg | ORAL_TABLET | Freq: Every day | ORAL | Status: DC
  Filled 2012-09-29 (×4): qty 1

## 2012-09-29 MED ORDER — BENZOCAINE-MENTHOL 20-0.5 % EX AERO
1.0000 "application " | INHALATION_SPRAY | CUTANEOUS | Status: DC | PRN
  Filled 2012-09-29: qty 56

## 2012-09-29 MED ORDER — LANOLIN HYDROUS EX OINT: TOPICAL_OINTMENT | CUTANEOUS | Status: DC | PRN

## 2012-09-29 MED ORDER — DIBUCAINE 1 % RE OINT
1.0000 "application " | TOPICAL_OINTMENT | RECTAL | Status: DC | PRN
  Filled 2012-09-29: qty 28

## 2012-09-29 MED ORDER — LABETALOL HCL 200 MG PO TABS
200.0000 mg | ORAL_TABLET | Freq: Two times a day (BID) | ORAL | Status: DC
  Administered 2012-09-29 – 2012-10-01 (×5): 200 mg via ORAL
  Filled 2012-09-29 (×7): qty 1

## 2012-09-29 NOTE — Progress Notes (Signed)
Pt c/o numbness remaining in her right thigh post epidural/delivery.  She describes tingling and heavy to lift her leg.  She asked Rn to call anesthesia because when they did rounds earlier they said to call if it wasn't better at 2100.  This RN called CRNA who will check on pt.

## 2012-09-29 NOTE — Progress Notes (Signed)
Dr. Rana Snare contacted about UC frequency and FHR variability.  Order received to reduce pitocin by half.  Current rate is 56mu/min.  New rate will be 3 mu/min.

## 2012-09-29 NOTE — Progress Notes (Signed)
Dr. Rana Snare notified of SVE and station.  Will come in to assess/attend delivery and push with nurse at 0430 unless patient feels pressure prior to this time.

## 2012-09-30 LAB — CBC
MCH: 31.1 pg (ref 26.0–34.0)
MCHC: 34.7 g/dL (ref 30.0–36.0)
Platelets: 97 10*3/uL — ABNORMAL LOW (ref 150–400)
RDW: 13.3 % (ref 11.5–15.5)

## 2012-09-30 LAB — RUBELLA SCREEN: Rubella: 345.2 IU/mL — ABNORMAL HIGH

## 2012-09-30 MED ORDER — RHO D IMMUNE GLOBULIN 1500 UNIT/2ML IJ SOLN
300.0000 ug | Freq: Once | INTRAMUSCULAR | Status: AC
  Administered 2012-09-30: 300 ug via INTRAMUSCULAR
  Filled 2012-09-30: qty 2

## 2012-09-30 NOTE — Anesthesia Postprocedure Evaluation (Signed)
  Anesthesia Post-op Note  Patient: [name]  Procedure(s) Performed: * No procedures listed *  Patient Location: PACU and Mother/Baby  Anesthesia Type: Epidural  Level of Consciousness: awake, alert  and oriented  Airway and Oxygen Therapy: Patient Spontanous Breathing  Post-op Pain: mild  Post-op Assessment: Post-op Vital signs reviewed  Post-op Vital Signs: Reviewed and stable  Complications: No apparent anesthesia complications

## 2012-09-30 NOTE — Progress Notes (Signed)
Post Partum Day one Subjective: no complaints  Objective: Blood pressure 112/72, pulse 79, temperature 98.1 F (36.7 C), temperature source Oral, resp. rate 18, height 5\' 8"  (1.727 m), weight 78.642 kg (173 lb 6 oz), last menstrual period 01/19/2012, SpO2 99.00%, unknown if currently breastfeeding.  Physical Exam:  General: alert Lochia: appropriate Uterine Fundus: firm Incision: healing well DVT Evaluation: No evidence of DVT seen on physical exam.   Basename 09/30/12 0530 09/28/12 1720  HGB 11.7* 12.9  HCT 33.7* 37.4    Assessment/Plan: Plan for discharge tomorrow   LOS: 2 days   [name] S 09/30/2012, 9:19 AM

## 2012-09-30 NOTE — Addendum Note (Signed)
Addendum  created 09/30/12 1422 by Angelis Gates M Mateen Franssen, RN   Modules edited:Charges VN, Notes Section    

## 2012-09-30 NOTE — Anesthesia Postprocedure Evaluation (Signed)
  Anesthesia Post-op Note  Patient: [name]  Procedure(s) Performed: * No procedures listed *  Patient Location: Mother/Baby  Anesthesia Type: Epidural  Level of Consciousness: awake, alert  and oriented  Airway and Oxygen Therapy: Patient Spontanous Breathing  Post-op Pain: none  Post-op Assessment: Post-op Vital signs reviewed, Patient's Cardiovascular Status Stable, Respiratory Function Stable, Patent Airway, Adequate PO intake, Pain level controlled, No headache, No backache, No residual numbness and No residual motor weakness  Post-op Vital Signs: Reviewed and stable  Complications: No apparent anesthesia complications

## 2012-09-30 NOTE — Addendum Note (Signed)
Addendum  created 09/30/12 1422 by Armanda Heritage, RN   Modules edited:Charges VN, Notes Section

## 2012-10-01 LAB — RH IG WORKUP (INCLUDES ABO/RH)
Gestational Age(Wks): 36
Unit division: 0

## 2012-10-01 NOTE — Discharge Summary (Signed)
Obstetric Discharge Summary Reason for Admission: onset of labor Prenatal Procedures: none Intrapartum Procedures: spontaneous vaginal delivery Postpartum Procedures: none Complications-Operative and Postpartum: none Hemoglobin  Date Value Range Status  09/30/2012 11.7* 12.0 - 15.0 g/dL Final     HCT  Date Value Range Status  09/30/2012 33.7* 36.0 - 46.0 % Final    Physical Exam:  General: alert, cooperative and appears stated age 41: appropriate Uterine Fundus: firm Incision: healing well, no significant drainage, no dehiscence, no significant erythema DVT Evaluation: No evidence of DVT seen on physical exam.  Discharge Diagnoses: Term Pregnancy-delivered  Discharge Information: Date: 10/01/2012 Activity: pelvic rest Diet: routine Medications: None Condition: improved Instructions: refer to practice specific booklet Discharge to: home   Newborn Data: Live born female  Birth Weight: 6 lb 2.6 oz (2795 g) APGAR: 9, 9  Home with mother.  [name] L 10/01/2012, 8:19 AM

## 2012-10-01 NOTE — Progress Notes (Signed)
Post Partum Day 2 Subjective: no complaints, up ad lib, voiding, tolerating PO and + flatus  Objective: Blood pressure 127/75, pulse 75, temperature 97.6 F (36.4 C), temperature source Oral, resp. rate 18, height 5\' 8"  (1.727 m), weight 78.642 kg (173 lb 6 oz), last menstrual period 01/19/2012, SpO2 99.00%, unknown if currently breastfeeding.  Physical Exam:  General: alert, cooperative and appears stated age Lochia: appropriate Uterine Fundus: firm Incision: healing well, no significant drainage, no dehiscence, no significant erythema DVT Evaluation: No evidence of DVT seen on physical exam.   Basename 09/30/12 0530 09/28/12 1720  HGB 11.7* 12.9  HCT 33.7* 37.4    Assessment/Plan: Discharge home   LOS: 3 days   Helene Bernstein L 10/01/2012, 8:18 AM

## 2012-10-03 ENCOUNTER — Encounter (HOSPITAL_COMMUNITY)
Admission: RE | Admit: 2012-10-03 | Discharge: 2012-10-03 | Disposition: A | Discharge status: Home / Self Care | Payer: BC Managed Care – PPO | Source: Ambulatory Visit | Attending: Obstetrics and Gynecology | Admitting: Obstetrics and Gynecology

## 2012-10-03 DIAGNOSIS — O923 Agalactia: Secondary | ICD-10-CM | POA: Insufficient documentation

## 2012-10-08 NOTE — Consult Note (Signed)
[name], [name]                  ACCOUNT NO.:  [id number]  MEDICAL RECORD NO.:  [id number]  LOCATION:  9159                          FACILITY:  [facility]  PHYSICIAN:  Coe Angelos L. Algis Lehenbauer, M.D.DATE OF BIRTH:  Apr 17, 1971  DATE OF CONSULTATION:  09/08/2012 DATE OF DISCHARGE:  09/10/2012                                CONSULTATION   ADDENDUM  This is a patient who presented to the emergency room complaining of not feeling well, having pain in her lower abdomen.  She was admitted for observation.  __________ see her and she went to ultrasound and the patient subsequently was admitted for preterm labor.  I do approve and agree with the above note __________.     Caniya Tagle L. Vincente Poli, M.D.     Florestine Avers  D:  10/08/2012  T:  10/08/2012  Job:  [id number]

## 2012-10-16 ENCOUNTER — Telehealth: Payer: Self-pay | Admitting: Physician Assistant

## 2012-10-16 NOTE — Telephone Encounter (Signed)
Ms. Zenaida Deed called in at 9:30pm regarding new LLE swelling. She recently gave birth on 09/29/12. She had some LE swelling during pregnancy but this resolved in the days after delivery. Post-delivery she has noticed pain in her left buttocks region that she was told may be r/t epidural. Today while walking at [facility] she began to notice increasing LLE edema along with sensation of leg fatigue and tightness. She called her OB/GYN and was told to go to the ER immediately for evaluation. She called our office for 2nd opinion. I agreed that she needs to go to the ED to r/o DVT especially since she a) is post-partum and possibly hypercoagualable and b) had interim resolution of swelling then now has asymmetric swelling. She asked where she should go as she was worried if she goes to [facility] that she will just be transferred. I am not sure of [facility]'s capability to do dopplers, so I do think she is able to go to [facility] or [facility] for further evaluation. She verbalized understanding and gratitude and plans to proceed.

## 2012-10-17 ENCOUNTER — Inpatient Hospital Stay (HOSPITAL_COMMUNITY)
Admission: EM | Admit: 2012-10-17 | Discharge: 2012-10-19 | DRG: 377 | Disposition: A | Discharge status: Home / Self Care | Payer: BC Managed Care – PPO | Attending: Internal Medicine | Admitting: Internal Medicine

## 2012-10-17 DIAGNOSIS — O871 Deep phlebothrombosis in the puerperium: Secondary | ICD-10-CM | POA: Diagnosis present

## 2012-10-17 DIAGNOSIS — Z79899 Other long term (current) drug therapy: Secondary | ICD-10-CM

## 2012-10-17 DIAGNOSIS — I82409 Acute embolism and thrombosis of unspecified deep veins of unspecified lower extremity: Secondary | ICD-10-CM

## 2012-10-17 DIAGNOSIS — I471 Supraventricular tachycardia: Secondary | ICD-10-CM

## 2012-10-17 DIAGNOSIS — R000 Tachycardia, unspecified: Secondary | ICD-10-CM

## 2012-10-17 DIAGNOSIS — R0609 Other forms of dyspnea: Secondary | ICD-10-CM

## 2012-10-17 DIAGNOSIS — Z8679 Personal history of other diseases of the circulatory system: Secondary | ICD-10-CM

## 2012-10-17 DIAGNOSIS — I824Z9 Acute embolism and thrombosis of unspecified deep veins of unspecified distal lower extremity: Secondary | ICD-10-CM | POA: Diagnosis present

## 2012-10-17 DIAGNOSIS — R600 Localized edema: Secondary | ICD-10-CM | POA: Diagnosis present

## 2012-10-17 DIAGNOSIS — R5383 Other fatigue: Secondary | ICD-10-CM

## 2012-10-17 DIAGNOSIS — M7989 Other specified soft tissue disorders: Secondary | ICD-10-CM

## 2012-10-17 DIAGNOSIS — D696 Thrombocytopenia, unspecified: Secondary | ICD-10-CM | POA: Diagnosis not present

## 2012-10-17 DIAGNOSIS — I871 Compression of vein: Secondary | ICD-10-CM | POA: Diagnosis present

## 2012-10-17 DIAGNOSIS — I4719 Other supraventricular tachycardia: Secondary | ICD-10-CM

## 2012-10-17 DIAGNOSIS — R200 Anesthesia of skin: Secondary | ICD-10-CM | POA: Diagnosis present

## 2012-10-17 DIAGNOSIS — I80299 Phlebitis and thrombophlebitis of other deep vessels of unspecified lower extremity: Secondary | ICD-10-CM

## 2012-10-17 LAB — CBC
MCH: 30.3 pg (ref 26.0–34.0)
MCHC: 34.1 g/dL (ref 30.0–36.0)
MCV: 89 fL (ref 78.0–100.0)
Platelets: 183 10*3/uL (ref 150–400)
RBC: 4.91 MIL/uL (ref 3.87–5.11)
RDW: 12.1 % (ref 11.5–15.5)

## 2012-10-17 LAB — BASIC METABOLIC PANEL
CO2: 25 mEq/L (ref 19–32)
Calcium: 9.3 mg/dL (ref 8.4–10.5)
Creatinine, Ser: 0.92 mg/dL (ref 0.50–1.10)
GFR calc non Af Amer: 78 mL/min — ABNORMAL LOW (ref >90)
Sodium: 139 mEq/L (ref 135–145)

## 2012-10-17 NOTE — ED Notes (Signed)
Pt reports that she had some swelling in lower extremities prior to given birth on Oct 2nd, reports that swelling went away, but yesterday swelling came back to L leg; with some slight discoloration; pt c/o  Pain to L  Buttocks; CMS intact, + DP, denies CP or SOB

## 2012-10-17 NOTE — ED Provider Notes (Signed)
History     CSN: [id number]  Arrival date & time 10/17/12  4098   First MD Initiated Contact with Patient 10/17/12 2229      Chief Complaint  Patient presents with  . Leg Swelling    (Consider location/radiation/quality/duration/timing/severity/associated sxs/prior treatment) HPI CC: L Leg swelling  41 yo female w/ leg swelling (L>R) that has been a chronic condition for pt since last trimester of pregnancy. Pt is postpartum 5 das. Leg swelling resolved 5 days postpartum. Returned yesterday in the L leg. Elevated leg overnight w/ complete resolution. Pt walked outside briefly and by the time she came back in she noticed that her LLE was very swollen, redish purple and had some numbness. This has since improved w/ rest. Pt is breastfeeding. Pt experienced sciatica type pain during pregnancy and continues to have some similar symptoms w/ lower back pain and pain down posterior leg on L.   Denies any fever, CP, SOB, lightheadedness, syncope     Past Medical History  Diagnosis Date  . Inappropriate sinus tachycardia   . Seasonal allergies   . Palpitations   . Fatigue     Past Surgical History  Procedure Date  . Wisdom tooth extraction     Family History  Problem Relation Age of Onset  . Heart attack Father   . Diabetes Brother   . Heart attack Maternal Grandfather   . Diabetes Father   . Hypertension Father   . Hypertension Brother     History  Substance Use Topics  . Smoking status: Never Smoker   . Smokeless tobacco: Never Used  . Alcohol Use: No    OB History    Grav Para Term Preterm Abortions TAB SAB Ect Mult Living   3 3 2 1      3       Review of Systems  Constitutional: Negative for fever, activity change, appetite change and fatigue.  Respiratory: Negative for chest tightness and shortness of breath.   Cardiovascular: Positive for leg swelling. Negative for chest pain.  Musculoskeletal: Positive for back pain and joint swelling. Negative for  myalgias, arthralgias and gait problem.  Neurological: Positive for numbness. Negative for dizziness, seizures, syncope, light-headedness and headaches.    Allergies  Cheese; Avelox; Toprol xl; and Zithromax  Home Medications   Current Outpatient Rx  Name Route Sig Dispense Refill  . CETIRIZINE HCL 10 MG PO TABS Oral Take 10 mg by mouth daily.    . IBUPROFEN 200 MG PO TABS Oral Take 400 mg by mouth every 6 (six) hours as needed. For pain    . LABETALOL HCL 100 MG PO TABS Oral Take 2 tablets (200 mg total) by mouth 2 (two) times daily. 360 tablet 1  . RANITIDINE HCL 75 MG PO TABS Oral Take 150 mg by mouth 2 (two) times daily.      BP 127/87  Pulse 101  Temp 97.8 F (36.6 C) (Oral)  Resp 18  SpO2 98%  Breastfeeding? Unknown  Physical Exam  Nursing note and vitals reviewed. Constitutional: She is oriented to person, place, and time. She appears well-developed and well-nourished. No distress.  HENT:  Head: Normocephalic and atraumatic.  Eyes: Pupils are equal, round, and reactive to light.  Neck: Normal range of motion.  Cardiovascular: Normal rate and intact distal pulses.   Pulmonary/Chest: No respiratory distress.  Abdominal: Normal appearance. She exhibits no distension.  Musculoskeletal: Normal range of motion.       LLE w/ 1+ pitting edema in  tibia and generalized edema up to distal thigh. No pain on palpation. Cooler to touch than right. Pulses palpable and sensation intact.   Neurological: She is alert and oriented to person, place, and time. No cranial nerve deficit.  Skin: Skin is warm and dry. No rash noted.  Psychiatric: She has a normal mood and affect. Her behavior is normal.    ED Course  Procedures (including critical care time)  Labs Reviewed  BASIC METABOLIC PANEL - Abnormal; Notable for the following:    GFR calc non Af Amer 78 (*)     All other components within normal limits  CBC   No results found.   No diagnosis found.    MDM  41 yo female  w/ sciatica w/ recurrent LLE swelling. Venous stasus likely secondary to venous insufficiency from physiologic changed related to pregnancy vs DVT. After lengthy discussion w/ pt who lives away and desires to continue breast feeding, will hold off on anticoagulation at this time and allow pt to stay overnight in CDU for Venous doppler in the am. Likely DC after doppler to rule out DVT. Pt aware that if DVT, she will need to go on anticoagulation. - Move pt to CDU - venous doppler of LLE - Signed out to overnight team who are aware of pt.  Shelly Flatten, MD Family Medicine PGY-2 10/18/2012, 12:06 AM         Ozella Rocks, MD 10/18/12 [phone]

## 2012-10-17 NOTE — ED Notes (Addendum)
Pt states left side lower extremity swelling, with redness. Lt leg begins to get numb when she walks. Pt states that currently laying on bed that she is not experiencing numbness. Pt also states HA

## 2012-10-18 ENCOUNTER — Encounter (HOSPITAL_COMMUNITY): Payer: Self-pay | Admitting: *Deleted

## 2012-10-18 ENCOUNTER — Inpatient Hospital Stay (HOSPITAL_COMMUNITY): Payer: BC Managed Care – PPO

## 2012-10-18 DIAGNOSIS — O871 Deep phlebothrombosis in the puerperium: Secondary | ICD-10-CM | POA: Insufficient documentation

## 2012-10-18 DIAGNOSIS — R209 Unspecified disturbances of skin sensation: Secondary | ICD-10-CM

## 2012-10-18 DIAGNOSIS — Z8679 Personal history of other diseases of the circulatory system: Secondary | ICD-10-CM | POA: Insufficient documentation

## 2012-10-18 DIAGNOSIS — R200 Anesthesia of skin: Secondary | ICD-10-CM | POA: Diagnosis present

## 2012-10-18 DIAGNOSIS — I82409 Acute embolism and thrombosis of unspecified deep veins of unspecified lower extremity: Secondary | ICD-10-CM | POA: Insufficient documentation

## 2012-10-18 DIAGNOSIS — D696 Thrombocytopenia, unspecified: Secondary | ICD-10-CM | POA: Diagnosis not present

## 2012-10-18 DIAGNOSIS — R600 Localized edema: Secondary | ICD-10-CM | POA: Diagnosis present

## 2012-10-18 DIAGNOSIS — M7989 Other specified soft tissue disorders: Secondary | ICD-10-CM

## 2012-10-18 DIAGNOSIS — I498 Other specified cardiac arrhythmias: Secondary | ICD-10-CM

## 2012-10-18 HISTORY — PX: OTHER SURGICAL HISTORY: SHX169

## 2012-10-18 LAB — HEPARIN LEVEL (UNFRACTIONATED): Heparin Unfractionated: 0.31 IU/mL (ref 0.30–0.70)

## 2012-10-18 LAB — APTT: aPTT: 27 seconds (ref 24–37)

## 2012-10-18 LAB — BASIC METABOLIC PANEL
BUN: 14 mg/dL (ref 6–23)
Creatinine, Ser: 0.77 mg/dL (ref 0.50–1.10)
GFR calc Af Amer: >90 mL/min (ref >90)
GFR calc non Af Amer: >90 mL/min (ref >90)
Potassium: 3.9 mEq/L (ref 3.5–5.1)

## 2012-10-18 LAB — CBC
HCT: 42.1 % (ref 36.0–46.0)
MCHC: 34.4 g/dL (ref 30.0–36.0)
RDW: 12.3 % (ref 11.5–15.5)

## 2012-10-18 LAB — ANTITHROMBIN III: AntiThromb III Func: 95 % (ref 75–120)

## 2012-10-18 LAB — PROTIME-INR
INR: 1.2 (ref 0.00–1.49)
Prothrombin Time: 15 seconds (ref 11.6–15.2)
Prothrombin Time: 15 seconds (ref 11.6–15.2)

## 2012-10-18 MED ORDER — ALPRAZOLAM 0.25 MG PO TABS: 0.2500 mg | ORAL_TABLET | Freq: Three times a day (TID) | ORAL | Status: DC | PRN

## 2012-10-18 MED ORDER — SODIUM CHLORIDE 0.9 % IV SOLN
10.0000 mg | INTRAVENOUS | Status: AC
  Administered 2012-10-18: 10 mg via INTRAVENOUS
  Filled 2012-10-18: qty 10

## 2012-10-18 MED ORDER — ACETAMINOPHEN 500 MG PO TABS
1000.0000 mg | ORAL_TABLET | Freq: Once | ORAL | Status: AC
  Administered 2012-10-18: 1000 mg via ORAL
  Filled 2012-10-18: qty 2

## 2012-10-18 MED ORDER — ACETAMINOPHEN 325 MG PO TABS
650.0000 mg | ORAL_TABLET | Freq: Four times a day (QID) | ORAL | Status: DC | PRN
  Administered 2012-10-19: 650 mg via ORAL
  Filled 2012-10-18: qty 2

## 2012-10-18 MED ORDER — SODIUM CHLORIDE 0.9 % IJ SOLN
3.0000 mL | Freq: Two times a day (BID) | INTRAMUSCULAR | Status: DC
  Administered 2012-10-18: 3 mL via INTRAVENOUS

## 2012-10-18 MED ORDER — ACETAMINOPHEN 325 MG PO TABS: 650.0000 mg | ORAL_TABLET | ORAL | Status: DC | PRN

## 2012-10-18 MED ORDER — IBUPROFEN 800 MG PO TABS
800.0000 mg | ORAL_TABLET | Freq: Three times a day (TID) | ORAL | Status: DC
  Administered 2012-10-18 (×2): 800 mg via ORAL
  Filled 2012-10-18 (×4): qty 1

## 2012-10-18 MED ORDER — HEPARIN (PORCINE) IN NACL 100-0.45 UNIT/ML-% IJ SOLN: 12.0000 [IU]/kg/h | INTRAMUSCULAR | Status: DC

## 2012-10-18 MED ORDER — IOHEXOL 300 MG/ML  SOLN
100.0000 mL | Freq: Once | INTRAMUSCULAR | Status: AC | PRN
  Administered 2012-10-18: 50 mL via INTRAVENOUS

## 2012-10-18 MED ORDER — MORPHINE SULFATE 2 MG/ML IJ SOLN: 1.0000 mg | INTRAMUSCULAR | Status: DC | PRN

## 2012-10-18 MED ORDER — HEPARIN (PORCINE) IN NACL 100-0.45 UNIT/ML-% IJ SOLN
16.0000 [IU]/kg/h | INTRAMUSCULAR | Status: DC
  Administered 2012-10-18: 16 [IU]/kg/h via INTRAVENOUS
  Filled 2012-10-18 (×2): qty 250

## 2012-10-18 MED ORDER — LORATADINE 10 MG PO TABS
10.0000 mg | ORAL_TABLET | Freq: Every day | ORAL | Status: DC
  Administered 2012-10-18 – 2012-10-19 (×2): 10 mg via ORAL
  Filled 2012-10-18 (×2): qty 1

## 2012-10-18 MED ORDER — PANTOPRAZOLE SODIUM 40 MG PO TBEC
40.0000 mg | DELAYED_RELEASE_TABLET | Freq: Every day | ORAL | Status: DC
  Administered 2012-10-19: 40 mg via ORAL
  Filled 2012-10-18: qty 1

## 2012-10-18 MED ORDER — LABETALOL HCL 200 MG PO TABS
200.0000 mg | ORAL_TABLET | Freq: Two times a day (BID) | ORAL | Status: DC
  Administered 2012-10-18 – 2012-10-19 (×3): 200 mg via ORAL
  Filled 2012-10-18 (×4): qty 1

## 2012-10-18 MED ORDER — MIDAZOLAM HCL 2 MG/2ML IJ SOLN
INTRAMUSCULAR | Status: AC | PRN
  Administered 2012-10-18 (×6): 1 mg via INTRAVENOUS

## 2012-10-18 MED ORDER — KETOROLAC TROMETHAMINE 30 MG/ML IJ SOLN
30.0000 mg | Freq: Once | INTRAMUSCULAR | Status: AC
  Administered 2012-10-18: 30 mg via INTRAVENOUS
  Filled 2012-10-18: qty 1

## 2012-10-18 MED ORDER — SODIUM CHLORIDE 0.9 % IJ SOLN: 3.0000 mL | INTRAMUSCULAR | Status: DC | PRN

## 2012-10-18 MED ORDER — OXYCODONE HCL 5 MG PO TABS: 5.0000 mg | ORAL_TABLET | ORAL | Status: DC | PRN

## 2012-10-18 MED ORDER — ACETAMINOPHEN 325 MG PO TABS: 650.0000 mg | ORAL_TABLET | Freq: Four times a day (QID) | ORAL | Status: DC | PRN

## 2012-10-18 MED ORDER — FENTANYL CITRATE 0.05 MG/ML IJ SOLN
INTRAMUSCULAR | Status: AC | PRN
  Administered 2012-10-18 (×2): 25 ug via INTRAVENOUS
  Administered 2012-10-18: 50 ug via INTRAVENOUS
  Administered 2012-10-18 (×2): 12.5 ug via INTRAVENOUS
  Administered 2012-10-18 (×2): 50 ug via INTRAVENOUS

## 2012-10-18 MED ORDER — FAMOTIDINE 20 MG PO TABS
20.0000 mg | ORAL_TABLET | Freq: Every day | ORAL | Status: DC
  Administered 2012-10-18: 20 mg via ORAL
  Filled 2012-10-18: qty 1

## 2012-10-18 MED ORDER — SODIUM CHLORIDE 0.9 % IV SOLN: 250.0000 mL | INTRAVENOUS | Status: DC | PRN

## 2012-10-18 MED ORDER — HEPARIN BOLUS VIA INFUSION
4000.0000 [IU] | Freq: Once | INTRAVENOUS | Status: AC
  Administered 2012-10-18: 4000 [IU] via INTRAVENOUS

## 2012-10-18 MED ORDER — HEPARIN (PORCINE) IN NACL 100-0.45 UNIT/ML-% IJ SOLN
1350.0000 [IU]/h | INTRAMUSCULAR | Status: DC
  Administered 2012-10-18 (×2): 1250 [IU]/h via INTRAVENOUS
  Filled 2012-10-18 (×2): qty 250

## 2012-10-18 NOTE — H&P (Addendum)
Chief Complaint:  Left leg swelling  HPI: 41 yo female postpartum approx one week of svd of 41 wga boy who had normal pregnancy except a subchorionic hemorrhagic clot on first trimester u/s and h/o atrial tachycardia.  Went into preterm labor and had uneventful vaginal delivery.  She and her husband state that on/off during her pregnancy she had lle swelling and some sob which lead to a ddimer that was slightly elevated and a subsequent cta of her chest which was neg for PE (may of this year).  She was also having some uncontrolled tachycardia for which she was also put on bedrest later in her pregnancy.  After delivery, the lle swelling totally resolved and was doing fine.  Then several days ago the leg started to swell again, she called her cardiologist and ob doc who instructed her to come to ED however she spoke to a friend who is an Char[employer] fundraiser who advised her to elevated it overnight which she did.  This morning she woke up the swelling had totally resolved, then later in the evening the swelling suddenly returned and started with a reddish discoloration to the entire left leg.  Her and her husband came to ED.  While in the ED the swelling again totally resolved while she was laying in bed, and then when she was about to be discharged she was getting up and the leg again swoll up again now is turning reddish again.  The leg is very numb and "heavy" feeling.  No real severe pain.  No h/o VTE in past.  Father had both cva and mi at the age of 73 and 29 years old.  Brother 11 yo healthy.  No recent travels.    Review of Systems:  O/w neg  Past Medical History: Past Medical History  Diagnosis Date  . Inappropriate sinus tachycardia   . Seasonal allergies   . Palpitations   . Fatigue    Past Surgical History  Procedure Date  . Wisdom tooth extraction     Medications: Prior to Admission medications   Medication Sig Start Date End Date Taking? Authorizing Provider  cetirizine (ZYRTEC) 10 MG tablet  Take 10 mg by mouth daily.   Yes Historical Provider, MD  ibuprofen (ADVIL,MOTRIN) 200 MG tablet Take 400 mg by mouth every 6 (six) hours as needed. For pain   Yes Historical Provider, MD  labetalol (NORMODYNE) 100 MG tablet Take 2 tablets (200 mg total) by mouth 2 (two) times daily. 07/28/12  Yes Hillis Range, MD  ranitidine (ZANTAC) 75 MG tablet Take 150 mg by mouth 2 (two) times daily.   Yes Historical Provider, MD    Allergies:   Allergies  Allergen Reactions  . Cheese Anaphylaxis  . Avelox (Moxifloxacin Hcl In Nacl) Hives  . Toprol Xl (Metoprolol Succinate) Hives  . Zithromax (Azithromycin) Hives    Social History:  reports that she has never smoked. She has never used smokeless tobacco. She reports that she does not drink alcohol or use illicit drugs.  Family History: Family History  Problem Relation Age of Onset  . Heart attack Father   . Diabetes Brother   . Heart attack Maternal Grandfather   . Diabetes Father   . Hypertension Father   . Hypertension Brother     Physical Exam: Filed Vitals:   10/17/12 1956 10/18/12 0100 10/18/12 0110  BP: 127/87    Pulse: 101    Temp: 97.8 F (36.6 C)    TempSrc: Oral  Resp: 18    Height:  5' 8.11" (1.73 m) 5\' 8"  (1.727 m)  Weight:  71.668 kg (158 lb) 71.668 kg (158 lb)  SpO2: 98%     General appearance: alert, cooperative and no distress Lungs: clear to auscultation bilaterally Heart: regular rate and rhythm, S1, S2 normal, no murmur, click, rub or gallop Abdomen: soft, non-tender; bowel sounds normal; no masses,  no organomegaly Extremities: edema and swelling to lle with  intact peripheral pulses Pulses: 2+ and symmetric Skin: Skin color, texture, turgor normal. No rashes or lesions Neurologic: Grossly normal    Labs on Admission:   [facility] 10/17/12 2000  NA 139  K 4.0  CL 105  CO2 25  GLUCOSE 94  BUN 14  CREATININE 0.92  CALCIUM 9.3  MG --  PHOS --    Basename 10/17/12 2000  WBC 5.8  NEUTROABS --    HGB 14.9  HCT 43.7  MCV 89.0  PLT 183   Radiological Exams on Admission: No results found.  Assessment/Plan Present on Admission:  41 yo female with new lle dvt postpartum with h/o atrial tachycardia .Swelling of limb, left .DVT (deep venous thrombosis) .Postpartum deep vein thrombosis .Numbness in left leg .Atrial tachycardia  Vascular surgery has been called and have seen patient.  Per patient and her husband they plan on doing direct thrombolysis in the am.  Hep gtt for now.  Will need to stop breastfeeding and pump for now.  Will do hypercoag w/u.  Needs antiocoagulants 3-6 months at least unless hypercoagulable w/u positive.  No sign of PE no cp or sob at this time.  Pt very tearful during entire interview, monitor closely for development of significant postpartum depression.  Bedside u/s was done by Dr. Hyacinth Meeker which showed significant clot above the knee.  Further recs per vascular surgery.     Shanette Tamargo A [phone] 10/18/2012, 3:01 AM   Spoke to radiologist here tonight who will let oncoming IR know in am to evaluate pt for further intervention.  Will place order also.

## 2012-10-18 NOTE — Procedures (Signed)
Left leg venogram Left venous thrombolysis Left iliac vein stent/angioplasty No complication No blood loss. See complete dictation in [facility].

## 2012-10-18 NOTE — Progress Notes (Signed)
[facility] Progress Note [place] TEAM 1 - Stepdown/ICU TEAM   [name] ZOX:[id number] DOB: 1971/08/08 DOA: 10/17/2012 PCP: Sheila Oats, MD  Brief narrative: 41 year old female patient who delivered her third child on 09/29/2012. Pregnancy was normal except for a subchorionic hemorrhagic clot during the first trimester found on ultrasound. She did experience preterm labor but otherwise had uneventful delivery and hospitalization. Discharged home with healthy infant and breast-feeding status. During the evaluation prior to discharge the obstetrician noted no lower extremity edema and no signs of DVT. Patient also had mild thrombocytopenia since September 2013 during the pregnancy with platelet count nadir of 97,000 noted 24 hours after delivery. From a historical perspective during the pregnancy she had some intermittent lower extremity swelling and shortness of breath. A d-dimer was obtained during the pregnancy which was mildly elevated but a CT of the chest performed May 2013 demonstrated no evidence of pulmonary embolism. In addition during the pregnancy the patient was having uncontrolled tachycardia and underwent an echocardiogram which was negative. Because of the tachycardia she was placed on bed rest. Several days prior to current admission patient began noticing left lower extremity swelling. When the edema in the lower extremity recurred the patient decided to elevate her leg and the edema resolved briefly but then returned and was much more significant and was associated with dependent rubra and duskiness. She contacted the obstetrician as well as the cardiologist who advised her to present to the emergency department for evaluation. In addition she was experiencing lower extremity numbness. She had no prior history of thrombus/DVT in the past nor with her previous pregnancies. Also no family history of coagulopathies or DVT. She was eventually evaluated by emergency room  physician Dr. Hyacinth Meeker who became concerned over the degree of edema in her leg and performed a bedside ultrasound which revealed extensive clot in the left lower extremity. She was subsequently admitted to the step down unit for further monitoring and treatment  Assessment/Plan:  Postpartum deep vein thrombosis *Appreciate the assistance of vascular surgery as well as interventional radiology *During our examination this morning the patient was on IV heparin with plans to undergo venous thrombolysis today under fluoroscopy in interventional radiology *Formal duplex studies of the left lower extremity been completed since admission and demonstrate extensive acute DVT involving the left saphenofemoral junction, common femoral, femoral, popliteal, posterior tibial and peroneal veins. There is complete absence of venous flow in the left lower extremity which confirms the need for acute thrombolysis therapy.  Leg edema, left leg (severe)/Numbness in left leg *Secondary to severe DVT *Dr. Sharon Seller had a lengthy discussion with patient regarding DVT and possibility of post phlebitic syndrome with hopes that undergoing thrombolytic therapy this admission will minimize postphlebitic syndrome and chronic lower extremity edema *Was allowed off bed rest post thrombolysis may benefit from PT evaluation  Thrombocytopenia *Patient had documented thrombocytopenia since September 2013 while pregnant *At presentation platelets were normal but since admission and initiation of heparin platelets have decreased slightly so we'll need to continue to monitor *Was taking ibuprofen at home and this had been continued here - because of need for chronic anticoagulation as well as recurrent thrombocytopenia we'll discontinue Nsaids in favor of narcotics and Tylenol  History of atrial tachycardia *Currently heart rate remains controlled *Took Normodyne at home and this has been continued during the admission  Breast feeding  status of mother/recent vaginal delivery *Because of ongoing anticoagulation needs the patient will be unable to continue breast feeding. This is quite distressing to  the patient and she is also very concerned over the possibility of developing mastitis so therefore we have requested lactation consultant to assist *She'll continue to pump and discard breast milk *Emotional support given to patient due to anxiety and depression for being separated from her infant - when necessary Xanax ordered *will need to avoid any potential hormone tx for lactation due to active DVT status  DVT prophylaxis: Currently on full dose heparin Code Status: Full Family Communication: Spoke extensively with the patient today Disposition Plan: Remain in step down  Consultants: Vascular surgery Interventional radiology Lactation consultant  Procedures: Catheter thrombolysis by interventional radiology initiated 10/18/2012  Antibiotics: None  HPI/Subjective: Alert and has many questions which were answered. Continues to complain of heaviness and fullness in left lower extremity without any significant pain, still has minor numbness in left lower extremity as well   Objective: Blood pressure 122/86, pulse 81, temperature 98.8 F (37.1 C), temperature source Oral, resp. rate 24, height 5\' 8"  (1.727 m), weight 73 kg (160 lb 15 oz), last menstrual period 10/18/2012, SpO2 100.00%, currently breastfeeding.  Intake/Output Summary (Last 24 hours) at 10/18/12 1345 Last data filed at 10/18/12 0900  Gross per 24 hour  Intake   11.5 ml  Output    300 ml  Net -288.5 ml     Exam: Followup exam completed  Data Reviewed: Basic Metabolic Panel:  Lab 10/18/12 1610 10/17/12 2000  NA 140 139  K 3.9 4.0  CL 109 105  CO2 22 25  GLUCOSE 94 94  BUN 14 14  CREATININE 0.77 0.92  CALCIUM 9.2 9.3  MG -- --  PHOS -- --   CBC:  Lab 10/18/12 1006 10/17/12 2000  WBC 6.6 5.8  NEUTROABS -- --  HGB 14.5 14.9  HCT  42.1 43.7  MCV 88.4 89.0  PLT 146* 183    Recent Results (from the past 240 hour(s))  MRSA PCR SCREENING     Status: Normal   Collection Time   10/18/12  4:34 AM      Component Value Range Status Comment   MRSA by PCR NEGATIVE  NEGATIVE Final      Studies:  Recent x-ray studies have been reviewed in detail by the Attending Physician  Scheduled Meds:  Reviewed in detail by the Attending Physician   Junious Silk, ANP [facility] Office  [phone] Pager [phone]  On-Call/Text Page:      [email or web address]      password [email or web address]  If 7PM-7AM, please contact night-coverage [email or web address] Password [email or web address] 10/18/2012, 1:45 PM   LOS: 1 day   I have personally examined this patient and reviewed the entire database. I have reviewed the above note, made any necessary editorial changes, and agree with its content.  Lonia Blood, MD [facility]

## 2012-10-18 NOTE — H&P (Signed)
[name] is an 41 y.o. female.   Chief Complaint: pt post partum 09/29/12  Left lower extremity swelling thigh to foot off and on for few days Most recently 10/19 pm - came to ER and sent home with elevation treatment Returned 10/20 pm with more swelling and doppler at bedside confirmed LLE dvt by Dr Eber Hong. Pt now on heparin and scheduled for left lower extremity venogram with probable thrombolysis and possible angioplasty/stent placement New "official" lower extremity doppler to be done asap Pt has no risk factors for thombolytics although she does still have some slight vaginal  discharge(spotting) post partum HPI: atrial tachycardia with exertion - sees Dr Johney Frame; allergies  Past Medical History  Diagnosis Date  . Inappropriate sinus tachycardia   . Seasonal allergies   . Palpitations   . Fatigue     Past Surgical History  Procedure Date  . Wisdom tooth extraction     Family History  Problem Relation Age of Onset  . Heart attack Father   . Diabetes Brother   . Heart attack Maternal Grandfather   . Diabetes Father   . Hypertension Father   . Hypertension Brother    Social History:  reports that she has never smoked. She has never used smokeless tobacco. She reports that she does not drink alcohol or use illicit drugs.  Allergies:  Allergies  Allergen Reactions  . Cheese Anaphylaxis  . Avelox (Moxifloxacin Hcl In Nacl) Hives  . Toprol Xl (Metoprolol Succinate) Hives  . Zithromax (Azithromycin) Hives    Medications Prior to Admission  Medication Sig Dispense Refill  . cetirizine (ZYRTEC) 10 MG tablet Take 10 mg by mouth daily.      Marland Kitchen ibuprofen (ADVIL,MOTRIN) 200 MG tablet Take 400 mg by mouth every 6 (six) hours as needed. For pain      . labetalol (NORMODYNE) 100 MG tablet Take 2 tablets (200 mg total) by mouth 2 (two) times daily.  360 tablet  1  . ranitidine (ZANTAC) 75 MG tablet Take 150 mg by mouth 2 (two) times daily.        Results for orders placed  during the hospital encounter of 10/17/12 (from the past 48 hour(s))  CBC     Status: Normal   Collection Time   10/17/12  8:00 PM      Component Value Range Comment   WBC 5.8  4.0 - 10.5 K/uL    RBC 4.91  3.87 - 5.11 MIL/uL    Hemoglobin 14.9  12.0 - 15.0 g/dL    HCT 40.9  81.1 - 91.4 %    MCV 89.0  78.0 - 100.0 fL    MCH 30.3  26.0 - 34.0 pg    MCHC 34.1  30.0 - 36.0 g/dL    RDW 78.2  95.6 - 21.3 %    Platelets 183  150 - 400 K/uL   BASIC METABOLIC PANEL     Status: Abnormal   Collection Time   10/17/12  8:00 PM      Component Value Range Comment   Sodium 139  135 - 145 mEq/L    Potassium 4.0  3.5 - 5.1 mEq/L    Chloride 105  96 - 112 mEq/L    CO2 25  19 - 32 mEq/L    Glucose, Bld 94  70 - 99 mg/dL    BUN 14  6 - 23 mg/dL    Creatinine, Ser 0.86  0.50 - 1.10 mg/dL    Calcium 9.3  8.4 - 10.5 mg/dL    GFR calc non Af Amer 78 (*) >90 mL/min    GFR calc Af Amer >90  >90 mL/min   APTT     Status: Normal   Collection Time   10/18/12  1:18 AM      Component Value Range Comment   aPTT 27  24 - 37 seconds   PROTIME-INR     Status: Normal   Collection Time   10/18/12  1:18 AM      Component Value Range Comment   Prothrombin Time 15.0  11.6 - 15.2 seconds    INR 1.20  0.00 - 1.49   ANTITHROMBIN III     Status: Normal   Collection Time   10/18/12  2:52 AM      Component Value Range Comment   AntiThromb III Func 95  75 - 120 %   MRSA PCR SCREENING     Status: Normal   Collection Time   10/18/12  4:34 AM      Component Value Range Comment   MRSA by PCR NEGATIVE  NEGATIVE    No results found.  Review of Systems  Constitutional: Negative for fever, chills and weight loss.  Respiratory: Negative for shortness of breath and wheezing.   Cardiovascular: Positive for leg swelling. Negative for chest pain.       LLE  Gastrointestinal: Negative for nausea, vomiting and abdominal pain.  Genitourinary: Negative for hematuria.  Musculoskeletal: Negative for back pain.    Blood  pressure 127/87, pulse 92, temperature 98.8 F (37.1 C), temperature source Oral, resp. rate 17, height 5\' 8"  (1.727 m), weight 160 lb 15 oz (73 kg), last menstrual period 10/18/2012, SpO2 97.00%, currently breastfeeding. Physical Exam  Constitutional: She is oriented to person, place, and time. She appears well-developed and well-nourished.  Cardiovascular: Normal rate and regular rhythm.   No murmur heard. Respiratory: Effort normal and breath sounds normal. She has no wheezes.  GI: Soft. Bowel sounds are normal. There is no tenderness.  Musculoskeletal: Normal range of motion. She exhibits edema and tenderness.       Left lower extremity with noted swelling - NT - thigh to foot; sl redness to skin; 2+ pulses  Neurological: She is alert and oriented to person, place, and time.  Skin: Skin is warm. There is erythema.       LLE  Psychiatric: She has a normal mood and affect. Her behavior is normal. Judgment and thought content normal.     Assessment/Plan 41 yo post partum 09/29/12 Left low leg swelling since 10/19 Heparin started 10/21 early am Pt now scheduled for [place] left lower extr asap Bedside doppler was + for left lower extremity venous thrombus (10/20 pm) Also scheduled for LLE venogram with probable transcatheter thrombolysis with possible mechanical thrombectomy and poss angioplasty/stent placement Pt aware of procedure benEfits and risks and agreeable to proceed Consent signed and in chart  Rama Mcclintock A 10/18/2012, 8:47 AM

## 2012-10-18 NOTE — Plan of Care (Signed)
Problem: Consults Goal: Venous Thromboembolism Patient Education See Patient Education Module for education specifics. Outcome: Completed/Met Date Met:  10/18/12 Printed patient information from [facility]  Goal: Diagnosis - Venous Thromboembolism (VTE) Choose a selection Outcome: Progressing DVT (Deep Vein Thrombosis)

## 2012-10-18 NOTE — ED Provider Notes (Signed)
I saw and evaluated the patient, reviewed the resident's note and I agree with the findings and plan.   .Face to face Exam:  General:  Awake HEENT:  Atraumatic Resp:  Normal effort Abd:  Nondistended Neuro:No focal weakness Lymph: No adenopathy   Nelia Shi, MD 10/18/12 0009

## 2012-10-18 NOTE — ED Provider Notes (Addendum)
  Physical Exam  BP 127/87  Pulse 101  Temp 97.8 F (36.6 C) (Oral)  Resp 18  SpO2 98%  Breastfeeding? Unknown  Physical Exam  ED Course  Procedures  MDM The patient has had decompensation in her symptoms, has had diffuse swelling and redness and increased pain in the left lower extremity, I cannot palpate a pulsatile dorsalis pedis however I am able to Doppler a pulse at that site. She has a cold, edematous leg on the left, I performed a bedside ultrasound and find that the patient has occlusive blood clot in the femoral veins as well as the popliteal vein. I believe that the patient has early phlegmasia as a possibility, a consultation pharmacy and they have agreed that we can use heparin without any crossover to the baby, I have also consult to Dr. Darrick Penna with vascular surgery who will come see the patient shortly.  Discussed with Dr. Darrick Penna after his evaluation, he agrees with having interventional radiology evaluate the patient at 8:00 in the morning for local thrombolytics, agrees with heparin overnight.    Vida Roller, MD 10/18/12 1610  Vida Roller, MD 10/18/12 [phone]

## 2012-10-18 NOTE — Consult Note (Signed)
[facility] [place] HISTORY AND PHYSICAL  Requesting: ER [facility], Dr Hyacinth Meeker Reason: DVT possible phlegmasia left leg  History of Present Illness:  Patient is a 41 y.o. year old female who presents for evaluation of swollen left leg with extensive DVT by ultrasound.  The pt is post partum by a few weeks.  This was a vaginal delivery.  No prior DVT. No history of hypercoag state.  Leg progressively more swollen and heavy over last few days.  Sensation is intact.  Some difficulty moving leg because "it is so heavy".  Other medical problems include atrial arrhythmia.  No history of GI bleed.  Still having some vaginal spotting occasionally. No other significant bleeding history.  Past Medical History  Diagnosis Date  . Inappropriate sinus tachycardia   . Seasonal allergies   . Palpitations   . Fatigue     Past Surgical History  Procedure Date  . Wisdom tooth extraction      Social History History  Substance Use Topics  . Smoking status: Never Smoker   . Smokeless tobacco: Never Used  . Alcohol Use: No    Family History Family History  Problem Relation Age of Onset  . Heart attack Father   . Diabetes Brother   . Heart attack Maternal Grandfather   . Diabetes Father   . Hypertension Father   . Hypertension Brother     Allergies  Allergies  Allergen Reactions  . Cheese Anaphylaxis  . Avelox (Moxifloxacin Hcl In Nacl) Hives  . Toprol Xl (Metoprolol Succinate) Hives  . Zithromax (Azithromycin) Hives     Current Facility-Administered Medications  Medication Dose Route Frequency Provider Last Rate Last Dose  . acetaminophen (TYLENOL) tablet 650 mg  650 mg Oral Q6H PRN Daleen Bo, MD      . heparin ADULT infusion 100 units/mL (25000 units/250 mL)  16 Units/kg/hr Intravenous Continuous Vida Roller, MD 11.5 mL/hr at 10/18/12 0151 16 Units/kg/hr at 10/18/12 0151  . heparin bolus via infusion 4,000 Units  4,000 Units Intravenous Once Vida Roller, MD    4,000 Units at 10/18/12 0151  . ketorolac (TORADOL) 30 MG/ML injection 30 mg  30 mg Intravenous Once Daleen Bo, MD   30 mg at 10/18/12 0151  . DISCONTD: acetaminophen (TYLENOL) tablet 650 mg  650 mg Oral Q4H PRN Nelia Shi, MD       Current Outpatient Prescriptions  Medication Sig Dispense Refill  . cetirizine (ZYRTEC) 10 MG tablet Take 10 mg by mouth daily.      Marland Kitchen ibuprofen (ADVIL,MOTRIN) 200 MG tablet Take 400 mg by mouth every 6 (six) hours as needed. For pain      . labetalol (NORMODYNE) 100 MG tablet Take 2 tablets (200 mg total) by mouth 2 (two) times daily.  360 tablet  1  . ranitidine (ZANTAC) 75 MG tablet Take 150 mg by mouth 2 (two) times daily.        ROS:   General:  No weight loss, Fever, chills  HEENT: No recent headaches, no nasal bleeding, no visual changes, no sore throat  Neurologic: No dizziness, blackouts, seizures. No recent symptoms of stroke or mini- stroke. No recent episodes of slurred speech, or temporary blindness.  Cardiac: No recent episodes of chest pain/pressure, no shortness of breath at rest.  No shortness of breath with exertion.  Vascular: No history of rest pain in feet.  No history of claudication.  No history of non-healing ulcer, No history of  DVT   Pulmonary: No home oxygen, no productive cough, no hemoptysis,  No asthma or wheezing  Musculoskeletal:  [ ]  Arthritis, [ ]  Low back pain,  [ ]  Joint pain  Hematologic:No history of hypercoagulable state.  No history of easy bleeding.  No history of anemia  Gastrointestinal: No hematochezia or melena,  No gastroesophageal reflux, no trouble swallowing, prior hemorrhoid not actively bleeding  Urinary: [ ]  chronic Kidney disease, [ ]  on HD - [ ]  MWF or [ ]  TTHS, [ ]  Burning with urination, [ ]  Frequent urination, [ ]  Difficulty urinating;   Skin: No rashes  Psychological: No history of anxiety,  No history of depression   Physical Examination  Filed Vitals:   10/17/12 1956 10/18/12  0100 10/18/12 0110  BP: 127/87    Pulse: 101    Temp: 97.8 F (36.6 C)    TempSrc: Oral    Resp: 18    Height:  5' 8.11" (1.73 m) 5\' 8"  (1.727 m)  Weight:  158 lb (71.668 kg) 158 lb (71.668 kg)  SpO2: 98%      Body mass index is 24.02 kg/(m^2).  General:  Alert and oriented, no acute distress HEENT: Normal Cardiac: Regular Rate and Rhythm Abdomen: Soft, non-tender, non-distended, no mass Skin: No rash Extremity Pulses:  2+ radial, brachial, femoral, dorsalis pedis, posterior tibial pulses bilaterally Musculoskeletal: No deformity, bluish hue to entire left lower extremity, overall leg tissue is soft, edema approximately 20-30% larger leg circumference left vs right  Neurologic: Upper and lower extremity motor 5/5 and symmetric however moving left leg is difficult due to weight, flexes ankle moves toes easily, sensation left foot intact to light touch  DATA:  CBC    Component Value Date/Time   WBC 5.8 10/17/2012 2000   RBC 4.91 10/17/2012 2000   HGB 14.9 10/17/2012 2000   HCT 43.7 10/17/2012 2000   PLT 183 10/17/2012 2000   MCV 89.0 10/17/2012 2000   MCH 30.3 10/17/2012 2000   MCHC 34.1 10/17/2012 2000   RDW 12.1 10/17/2012 2000    BMET    Component Value Date/Time   NA 139 10/17/2012 2000   K 4.0 10/17/2012 2000   CL 105 10/17/2012 2000   CO2 25 10/17/2012 2000   GLUCOSE 94 10/17/2012 2000   BUN 14 10/17/2012 2000   CREATININE 0.92 10/17/2012 2000   CALCIUM 9.3 10/17/2012 2000   GFRNONAA 78* 10/17/2012 2000   GFRAA >90 10/17/2012 2000    ASSESSMENT: Extensive DVT left leg but not currently limb threatening.  Arterial circulation intact, no compartment syndrome.  No current evidence of pulmonary embolus.   PLAN:  Heparin IV.  Will need at least 6 months of coumadin then consider whether longer course is needed.  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  Have discussed this with Dr Hyacinth Meeker and the medical service  will admit the patient and consult Interventional Radiology.  Hopefully start lysis later this morning. Call if questions  Fabienne Bruns, MD [facility] [place] Office: [phone] Pager: [phone]

## 2012-10-18 NOTE — ED Notes (Signed)
Dr. Darrick Penna, Vasc-Surgeon at bedside.

## 2012-10-18 NOTE — Progress Notes (Addendum)
*  Preliminary Results* Left lower extremity venous duplex completed. There is evidence of extensive acute deep vein thombosis involving the left saphenofemoral junction, common femoral, femoral, popliteal, posterior tibial and peroneal veins. There is absence of venous flow in the left lower extremity. The right common femoral artery has no evidence of deep vein thrombosis. Preliminary results discussed with Aggie Cosier, RN and Juliette Alcide from interventional radiology who discussed findings with Dr.Hassell.  10/18/2012 10:11 AM Gertie Fey, RDMS, RDCS

## 2012-10-18 NOTE — Progress Notes (Signed)
ANTICOAGULATION CONSULT NOTE - Follow up Consult  Pharmacy Consult for Heparin Indication: DVT  Allergies  Allergen Reactions  . Cheese Anaphylaxis  . Avelox (Moxifloxacin Hcl In Nacl) Hives  . Toprol Xl (Metoprolol Succinate) Hives  . Zithromax (Azithromycin) Hives    Patient Measurements: Height: 5\' 8"  (172.7 cm) Weight: 160 lb 15 oz (73 kg) IBW/kg (Calculated) : 63.9  Heparin Dosing Weight: 70 kg  Vital Signs: Temp: 98.8 F (37.1 C) (10/21 0700) Temp src: Oral (10/21 0700) BP: 122/86 mmHg (10/21 1245) Pulse Rate: 81  (10/21 1245)  Labs:  Basename 10/18/12 1006 10/18/12 0118 10/17/12 2000  HGB 14.5 -- 14.9  HCT 42.1 -- 43.7  PLT 146* -- 183  APTT -- 27 --  LABPROT 15.0 15.0 --  INR 1.20 1.20 --  HEPARINUNFRC 0.35 -- --  CREATININE 0.77 -- 0.92  CKTOTAL -- -- --  CKMB -- -- --  TROPONINI -- -- --    Estimated Creatinine Clearance: 96.2 ml/min (by C-G formula based on Cr of 0.77).   Medical History: Past Medical History  Diagnosis Date  . Inappropriate sinus tachycardia   . Seasonal allergies   . Palpitations   . Fatigue    Recent birth--11 days old--currently breastfeeding  Medications:  Zyrtec  Ibuprofen  Labetalol  Zantac  Assessment: 41 yo female with extensive DVT started on Heparin this morning, now s/p left leg thrombolysis. Initial heparin level was at goal. No bleeding complications noted. Will make small rate increase to help ensure patient stays within therapeutic range.  Goal of Therapy:  Heparin level 0.3-0.7 units/ml Monitor platelets by anticoagulation protocol: Yes   Plan:  Increase heparin to 1250 units/hr Check heparin level in 6 hours.  Severiano Gilbert 10/18/2012,2:25 PM

## 2012-10-18 NOTE — Progress Notes (Signed)
ANTICOAGULATION CONSULT NOTE - Follow up Consult  Pharmacy Consult for Heparin Indication: DVT  Allergies  Allergen Reactions  . Cheese Anaphylaxis  . Avelox (Moxifloxacin Hcl In Nacl) Hives  . Toprol Xl (Metoprolol Succinate) Hives  . Zithromax (Azithromycin) Hives   Patient Measurements: Height: 5\' 8"  (172.7 cm) Weight: 160 lb 15 oz (73 kg) IBW/kg (Calculated) : 63.9  Heparin Dosing Weight: 70 kg  Vital Signs: Temp: 98.1 F (36.7 C) (10/21 1506) Temp src: Oral (10/21 1506) BP: 112/67 mmHg (10/21 1700) Pulse Rate: 81  (10/21 1245)  Labs:  Basename 10/18/12 2048 10/18/12 1006 10/18/12 0118 10/17/12 2000  HGB -- 14.5 -- 14.9  HCT -- 42.1 -- 43.7  PLT -- 146* -- 183  APTT -- -- 27 --  LABPROT -- 15.0 15.0 --  INR -- 1.20 1.20 --  HEPARINUNFRC 0.31 0.35 -- --  CREATININE -- 0.77 -- 0.92  CKTOTAL -- -- -- --  CKMB -- -- -- --  TROPONINI -- -- -- --   Estimated Creatinine Clearance: 96.2 ml/min (by C-G formula based on Cr of 0.77).  Medical History: Past Medical History  Diagnosis Date  . Inappropriate sinus tachycardia   . Seasonal allergies   . Palpitations   . Fatigue    Recent birth--80 days old--currently breastfeeding  Medications:  Zyrtec  Ibuprofen  Labetalol  Zantac  Assessment: 41 yo female with extensive DVT started on Heparin this morning, now s/p left leg thrombolysis. Follow up heparin level is within goal at 0.31. No bleeding complications noted. Will make small rate increase to help ensure patient stays within therapeutic range.  Goal of Therapy:  Heparin level 0.3-0.7 units/ml Monitor platelets by anticoagulation protocol: Yes   Plan:  Increase heparin to 1350 units/hr Check heparin level in 6 hours.  Nadara Mustard, PharmD., MS Clinical Pharmacist Pager:  [phone] Thank you for allowing pharmacy to be part of this patients care team. 10/18/2012,10:03 PM

## 2012-10-18 NOTE — Progress Notes (Signed)
ANTICOAGULATION CONSULT NOTE - Initial Consult  Pharmacy Consult for Heparin Indication: DVT  Allergies  Allergen Reactions  . Cheese Anaphylaxis  . Avelox (Moxifloxacin Hcl In Nacl) Hives  . Toprol Xl (Metoprolol Succinate) Hives  . Zithromax (Azithromycin) Hives    Patient Measurements: Height: 5\' 8"  (172.7 cm) Weight: 158 lb (71.668 kg) IBW/kg (Calculated) : 63.9  Heparin Dosing Weight: 70 kg  Vital Signs: Temp: 97.8 F (36.6 C) (10/20 1956) Temp src: Oral (10/20 1956) BP: 127/87 mmHg (10/20 1956) Pulse Rate: 101  (10/20 1956)  Labs:  Basename 10/17/12 2000  HGB 14.9  HCT 43.7  PLT 183  APTT --  LABPROT --  INR --  HEPARINUNFRC --  CREATININE 0.92  CKTOTAL --  CKMB --  TROPONINI --    Estimated Creatinine Clearance: 83.6 ml/min (by C-G formula based on Cr of 0.92).   Medical History: Past Medical History  Diagnosis Date  . Inappropriate sinus tachycardia   . Seasonal allergies   . Palpitations   . Fatigue    Recent birth--44 days old--currently breastfeeding  Medications:  Zyrtec  Ibuprofen  Labetalol  Zantac  Assessment: 41 yo female with DVTs for Heparin  Goal of Therapy:  Heparin level 0.3-0.7 units/ml Monitor platelets by anticoagulation protocol: Yes   Plan:  Heparin 4000 units Iv bolus, then 1150 units/hr Check heparin level in 6 hours.  Rockwell Zentz, Gary Fleet 10/18/2012,1:11 AM

## 2012-10-19 ENCOUNTER — Encounter (HOSPITAL_COMMUNITY): Payer: Self-pay

## 2012-10-19 LAB — LUPUS ANTICOAGULANT PANEL
DRVVT: 33.2 secs (ref <42.9)
Lupus Anticoagulant: NOT DETECTED
PTT Lupus Anticoagulant: 87.8 secs — ABNORMAL HIGH (ref 28.0–43.0)

## 2012-10-19 LAB — BASIC METABOLIC PANEL
GFR calc Af Amer: >90 mL/min (ref >90)
GFR calc non Af Amer: 88 mL/min — ABNORMAL LOW (ref >90)
Potassium: 3.7 mEq/L (ref 3.5–5.1)
Sodium: 140 mEq/L (ref 135–145)

## 2012-10-19 LAB — HEPARIN LEVEL (UNFRACTIONATED): Heparin Unfractionated: 0.37 IU/mL (ref 0.30–0.70)

## 2012-10-19 LAB — CBC
Hemoglobin: 13 g/dL (ref 12.0–15.0)
Platelets: 119 10*3/uL — ABNORMAL LOW (ref 150–400)
RBC: 4.34 MIL/uL (ref 3.87–5.11)

## 2012-10-19 LAB — PROTEIN C ACTIVITY: Protein C Activity: 129 % (ref 75–133)

## 2012-10-19 MED ORDER — RIVAROXABAN 15 MG PO TABS
15.0000 mg | ORAL_TABLET | Freq: Two times a day (BID) | ORAL | Status: DC
  Administered 2012-10-19: 15 mg via ORAL
  Filled 2012-10-19 (×2): qty 1

## 2012-10-19 MED ORDER — RIVAROXABAN 20 MG PO TABS: 20.0000 mg | ORAL_TABLET | Freq: Every day | ORAL | Status: DC

## 2012-10-19 MED ORDER — RIVAROXABAN 15 MG PO TABS: 15.0000 mg | ORAL_TABLET | Freq: Two times a day (BID) | ORAL | Status: DC

## 2012-10-19 NOTE — Progress Notes (Signed)
ANTICOAGULATION CONSULT NOTE - Follow Up Consult  Pharmacy Consult for Heparin Indication: DVT  Allergies  Allergen Reactions  . Cheese Anaphylaxis  . Avelox (Moxifloxacin Hcl In Nacl) Hives  . Toprol Xl (Metoprolol Succinate) Hives  . Zithromax (Azithromycin) Hives    Patient Measurements: Heparin Dosing Weight: 70kg  Vital Signs: Temp: 98.5 F (36.9 C) (10/22 0800) Temp src: Oral (10/22 0800) BP: 123/72 mmHg (10/22 0800)  Labs:  Basename 10/19/12 0524 10/18/12 2048 10/18/12 1006 10/18/12 0118 10/17/12 2000  HGB 13.0 -- 14.5 -- --  HCT 38.6 -- 42.1 -- 43.7  PLT 119* -- 146* -- 183  APTT -- -- -- 27 --  LABPROT -- -- 15.0 15.0 --  INR -- -- 1.20 1.20 --  HEPARINUNFRC 0.37 0.31 0.35 -- --  CREATININE 0.83 -- 0.77 -- 0.92  CKTOTAL -- -- -- -- --  CKMB -- -- -- -- --  TROPONINI -- -- -- -- --    Estimated Creatinine Clearance: 92.7 ml/min (by C-G formula based on Cr of 0.83).  Medications:  Heparin @ 1350 units/hr  Assessment: [name] continues on heparin for extensive LLE DVT s/p left venous thrombolysis and left iliac vein stent/angioplasty yesterday. Heparin level is therapeutic. Platelets continue to decrease [phone]) - MD aware. No bleeding noted.  Goal of Therapy:  Heparin level 0.3-0.7 units/ml Monitor platelets by anticoagulation protocol: Yes   Plan:  1) Continue heparin at 1350 units/hr 2) Follow up heparin level, CBC in AM  Fredrik Rigger 10/19/2012,8:29 AM

## 2012-10-19 NOTE — Progress Notes (Signed)
Subjective: Pt seen this am, feeling ok. Minimal pain behind (L)knee   Objective: Physical Exam: BP 123/72  Pulse 81  Temp 98.5 F (36.9 C) (Oral)  Resp 14  Ht 5\' 8"  (1.727 m)  Wt 160 lb 15 oz (73 kg)  BMI 24.47 kg/m2  SpO2 98%  LMP 10/18/2012  Breastfeeding? Yes (L)LE still with 1-2+ residual edema, but very soft, NT Popliteal dressing clean, dry, no hematoma   Labs: CBC  Basename 10/19/12 0524 10/18/12 1006  WBC 5.3 6.6  HGB 13.0 14.5  HCT 38.6 42.1  PLT 119* 146*   BMET  Basename 10/19/12 0524 10/18/12 1006  NA 140 140  K 3.7 3.9  CL 110 109  CO2 23 22  GLUCOSE 87 94  BUN 12 14  CREATININE 0.83 0.77  CALCIUM 8.3* 9.2   LFT No results found for this basename: PROT,ALBUMIN,AST,ALT,ALKPHOS,BILITOT,BILIDIR,IBILI,LIPASE in the last 72 hours PT/INR  Basename 10/18/12 1006 10/18/12 0118  LABPROT 15.0 15.0  INR 1.20 1.20     Studies/Results: Ir Veno/ext/uni Left  10/19/2012  *RADIOLOGY REPORT*  Clinical data:  Symptomatic left lower extremity DVT.  19 days postpartum.  LEFT LOWER EXTREMITY VENOGRAPHY ULTRASOUND GUIDANCE FOR VENOUS ACCESS CATHETER DIRECTED THROMBOLYTIC INFUSION MECHANICAL THROMBECTOMY VENOUS ANGIOPLASTY VENOUS STENT PLACEMENT  Comparison:  None  Technique and findings: The procedure, risks (including but not limited to bleeding, infection, organ damage), benefits, and alternatives were explained to the patient.  Questions regarding the procedure were encouraged and answered.  The patient understands and consents to the procedure.  Intravenous Fentanyl and Versed were administered as conscious sedation during continuous cardiorespiratory monitoring by the radiology RN, with a total moderate sedation time of 128 minutes.  The left posterior popliteal region was prepped draped usual sterile fashion. Maximal barrier sterile technique was utilized including caps, mask, sterile gowns, sterile gloves, sterile drape, hand hygiene and skin antiseptic.  Under  real time ultrasound guidance, the left popliteal vein below the knee was accessed with a 21-gauge micropuncture needle in a single pass.  The guide wire advanced easily within the lumen of the vessel, position confirmed on fluoroscopy.  The needle was exchanged for a transitional dilator, which allowed advancement of a [facility] guide wire centrally.  Over this, a 5-French Kumpe catheter was advanced for left lower extremity venography.  Venography demonstrated no thrombus in the popliteal vein, minimal residual nonocclusive thrombus in the superficial femoral vein. There is occlusive thrombus centrally in the common femoral vein through the left iliac venous system. There is minimal venous collateral drainage pathways.  The right common iliac vein and IVC remain widely patent.  The Kumpe was exchanged over guide wire for a 7-French vascular sheath, and a vert catheter was advanced and used with the aid of an angled glide wire to traverse the high grade obstructive lesion at the confluence of the left iliac vein and IVC.  The vert catheter was exchanged for the AngioJet catheter, and 10 mg TPA were instilled through the thrombus using pulse spray technique. The TPA was given 20 minutes for lysis.  The AngioJet catheter was then used as a mechanical thrombectomy device to extract residual thrombus from the common femoral and iliac veins.  Follow-up venography at this point demonstrates antegrade flow through the iliac venous system.  There is some residual nonocclusive thrombus in the external iliac vein.  There remains a high grade stenotic lesion at the confluence of the left common iliac vein with the IVC.  The 10 mm x 4  cm Conquest angioplasty balloon catheter was used to macerate thrombus through the iliac venous system.  This balloon would not however traverse the stenosis at the confluence with the IVC, so a 7 mm x 4 cm Mustang angioplasty balloon was then used to traverse this lesion and partially dilate the  lesion, which was then further dilated with the 10 mm Conquest balloon.  Follow-up venography at this point demonstrates better antegrade flow through the iliac venous system.  There is still significant irregularity and narrowing centrally in the common iliac vein near its confluence with the IVC.  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  This was dilated with a 12 mm Atlas angioplasty balloon catheter.  Residual mural thrombus in the external iliac vein was further macerated with the 12 mm Atlas angioplasty catheter.  Following venography showed some recoil through the left external iliac venous system to  the common femoral vein, but good antegrade flow with no significant collateral filling.  Given the increased potential risk of prolonged systemic thrombolysis due to the patient's recent postpartum state, no additional thrombolysis was pursued.  The catheter, sheath and guide wire were removed and hemostasis achieved at the site. The patient tolerated the procedure well.  No immediate complication.  IMPRESSION: 1.  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 2.  Occlusive acute thrombus through the left iliac venous system extending to the common femoral vein. 3.  Technically successful thrombolysis and mechanical thrombectomy of the acute left iliofemoral occlusive thrombus. 4.  Technically successful stent-assisted balloon angioplasty of the left common iliac vein stenosis, with restoration of good antegrade flow. 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 The patient should remain on therapeutic anticoagulation for at least 6 months if tolerated.   Original Report Authenticated By: Osa Craver, M.D.      Ir Pta Venous Left  10/19/2012  *RADIOLOGY REPORT*  Clinical data:  Symptomatic left lower extremity DVT.  19 days postpartum.  LEFT LOWER EXTREMITY VENOGRAPHY ULTRASOUND GUIDANCE FOR VENOUS ACCESS CATHETER DIRECTED THROMBOLYTIC INFUSION MECHANICAL THROMBECTOMY VENOUS ANGIOPLASTY VENOUS STENT PLACEMENT  Comparison:  None  Technique and findings: The procedure, risks (including but not limited to bleeding, infection, organ damage), benefits, and alternatives were explained to the patient.  Questions regarding the procedure were encouraged and answered.  The patient understands and consents to the procedure.  Intravenous Fentanyl and Versed were administered as conscious sedation during continuous cardiorespiratory monitoring by the radiology RN, with a total moderate sedation time of 128 minutes.  The left posterior popliteal region was prepped draped usual sterile fashion. Maximal barrier sterile technique was utilized including caps, mask, sterile gowns, sterile gloves, sterile drape, hand hygiene and skin antiseptic.  Under real time ultrasound guidance, the left popliteal vein below the knee was accessed with a 21-gauge micropuncture needle in a single pass.  The guide wire advanced easily within the lumen of the vessel, position confirmed on fluoroscopy.  The needle was exchanged for a transitional dilator, which allowed advancement of a [facility] guide wire centrally.  Over this, a 5-French Kumpe catheter was advanced for left lower extremity venography.  Venography demonstrated no thrombus in the popliteal vein, minimal residual nonocclusive thrombus in the  superficial femoral vein. There is occlusive thrombus centrally in the common femoral vein through the left iliac venous system. There is minimal venous collateral drainage pathways.  The right common iliac vein and IVC remain widely patent.  The Kumpe was exchanged over guide wire for a 7-French vascular sheath, and a vert catheter was advanced and used  with the aid of an angled glide wire to traverse the high grade obstructive lesion at the confluence of the left iliac vein and IVC.  The vert catheter was exchanged for the AngioJet catheter, and 10 mg TPA were instilled through the thrombus using pulse spray technique. The TPA was given 20 minutes for lysis.  The AngioJet catheter was then used as a mechanical thrombectomy device to extract residual thrombus from the common femoral and iliac veins.  Follow-up venography at this point demonstrates antegrade flow through the iliac venous system.  There is some residual nonocclusive thrombus in the external iliac vein.  There remains a high grade stenotic lesion at the confluence of the left common iliac vein with the IVC.  The 10 mm x 4 cm Conquest angioplasty balloon catheter was used to macerate thrombus through the iliac venous system.  This balloon would not however traverse the stenosis at the confluence with the IVC, so a 7 mm x 4 cm Mustang angioplasty balloon was then used to traverse this lesion and partially dilate the lesion, which was then further dilated with the 10 mm Conquest balloon.  Follow-up venography at this point demonstrates better antegrade flow through the iliac venous system.  There is still significant irregularity and narrowing centrally in the common iliac vein near its confluence with the IVC.  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  This was dilated with a 12 mm Atlas angioplasty balloon catheter.  Residual mural thrombus in the external iliac vein was further macerated with the 12 mm Atlas angioplasty catheter.  Following venography showed some recoil through the left external iliac venous system to  the common femoral vein, but good antegrade flow with no significant collateral  filling.  Given the increased potential risk of prolonged systemic thrombolysis due to the patient's recent postpartum state, no additional thrombolysis was pursued.  The catheter, sheath and guide wire were removed and hemostasis achieved at the site. The patient tolerated the procedure well.  No immediate complication.  IMPRESSION: 1.  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 2.  Occlusive acute thrombus through the left iliac venous system extending to the common femoral vein. 3.  Technically successful thrombolysis and mechanical thrombectomy of the acute left iliofemoral occlusive thrombus. 4.  Technically successful stent-assisted balloon angioplasty of the left common iliac vein stenosis, with restoration of good antegrade flow. 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 The patient should remain on therapeutic anticoagulation for at least 6 months if tolerated.   Original Report Authenticated By: Osa Craver, M.D.    Ir Thrombect Veno Mech Mod Sed  10/19/2012  *RADIOLOGY REPORT*  Clinical data:  Symptomatic left lower extremity DVT.  19 days postpartum.  LEFT LOWER EXTREMITY VENOGRAPHY ULTRASOUND GUIDANCE FOR VENOUS ACCESS CATHETER DIRECTED THROMBOLYTIC INFUSION MECHANICAL THROMBECTOMY VENOUS ANGIOPLASTY VENOUS STENT PLACEMENT  Comparison:  None  Technique and findings: The procedure, risks (including but not limited to bleeding, infection, organ damage), benefits, and alternatives were explained to the patient.  Questions regarding the procedure were encouraged and answered.  The patient understands and consents to the procedure.  Intravenous Fentanyl and Versed were administered as conscious sedation during continuous cardiorespiratory monitoring by the radiology RN, with a total moderate sedation time of 128 minutes.  The left posterior popliteal region was prepped draped usual sterile  fashion. Maximal barrier sterile technique was utilized including caps, mask, sterile gowns, sterile gloves, sterile drape, hand hygiene and skin antiseptic.  Under real time ultrasound guidance, the left popliteal vein below the knee was accessed with a 21-gauge micropuncture needle in a single pass.  The guide wire advanced easily within the lumen of the vessel, position confirmed on fluoroscopy.  The needle was exchanged for a transitional dilator, which allowed advancement of a [facility] guide wire centrally.  Over this, a 5-French Kumpe catheter was advanced for left lower extremity venography.  Venography demonstrated no thrombus in the popliteal vein, minimal residual nonocclusive thrombus in the superficial femoral vein. There is occlusive thrombus centrally in the common femoral vein through the left iliac venous system. There is minimal venous collateral drainage pathways.  The right common iliac vein and IVC remain widely patent.  The Kumpe was exchanged over guide wire for a 7-French vascular sheath, and a vert catheter was advanced and used with the aid of an angled glide wire to traverse the high grade obstructive lesion at the confluence of the left iliac vein and IVC.  The vert catheter was exchanged for the AngioJet catheter, and 10 mg TPA were instilled through the thrombus using pulse spray technique. The TPA was given 20 minutes for lysis.  The AngioJet catheter was then used as a mechanical thrombectomy device to extract residual thrombus from the common femoral and iliac veins.  Follow-up venography at this point demonstrates antegrade flow through the iliac venous system.  There is some residual nonocclusive thrombus in the external iliac vein.  There remains a high grade stenotic lesion at the confluence of the left common iliac vein with the IVC.  The 10 mm x 4 cm Conquest angioplasty balloon catheter was used to macerate thrombus through the iliac venous system.  This balloon would not however  traverse the stenosis at the confluence with the IVC, so a 7 mm x 4 cm Mustang angioplasty balloon was then used to traverse this lesion and partially dilate the lesion, which was then further dilated with the 10 mm Conquest balloon.  Follow-up venography at this point demonstrates better antegrade flow through the iliac venous system.  There is still significant irregularity and narrowing centrally in the common iliac vein near its confluence with the IVC.  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  This was dilated with a 12 mm Atlas angioplasty balloon catheter.  Residual mural thrombus in the external iliac vein was further macerated with the 12 mm Atlas angioplasty catheter.  Following venography showed some recoil through the left external iliac venous system to  the common femoral vein, but good antegrade flow with no significant collateral filling.  Given the increased potential risk of prolonged systemic thrombolysis due to the patient's recent postpartum state, no  additional thrombolysis was pursued.  The catheter, sheath and guide wire were removed and hemostasis achieved at the site. The patient tolerated the procedure well.  No immediate complication.  IMPRESSION: 1.  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 2.  Occlusive acute thrombus through the left iliac venous system extending to the common femoral vein. 3.  Technically successful thrombolysis and mechanical thrombectomy of the acute left iliofemoral occlusive thrombus. 4.  Technically successful stent-assisted balloon angioplasty of the left common iliac vein stenosis, with restoration of good antegrade flow. Some residual chronic thrombus remains in the left external iliac  vein. The patient should remain on therapeutic anticoagulation for at least 6 months if tolerated.   Original Report Authenticated By: Osa Craver, M.D.    Ir US Guide Vasc Access Left  10/19/2012  *RADIOLOGY REPORT*  Clinical data:  Symptomatic left lower extremity DVT.  19 days postpartum.  LEFT LOWER EXTREMITY VENOGRAPHY ULTRASOUND GUIDANCE FOR VENOUS ACCESS CATHETER DIRECTED THROMBOLYTIC INFUSION MECHANICAL THROMBECTOMY VENOUS ANGIOPLASTY VENOUS STENT PLACEMENT  Comparison:  None  Technique and findings: The procedure, risks (including but not limited to bleeding, infection, organ damage), benefits, and alternatives were explained to the patient.  Questions regarding the procedure were encouraged and answered.  The patient understands and consents to the procedure.  Intravenous Fentanyl and Versed were administered as conscious sedation during continuous cardiorespiratory monitoring by the radiology RN, with a total moderate sedation time of 128 minutes.  The left posterior popliteal region was prepped draped usual sterile fashion. Maximal barrier sterile technique was utilized including caps, mask, sterile gowns, sterile gloves, sterile drape, hand hygiene and skin antiseptic.  Under real time ultrasound guidance, the left popliteal vein below the knee was accessed with a 21-gauge micropuncture needle in a single pass.  The guide wire advanced easily within the lumen of the vessel, position confirmed on fluoroscopy.  The needle was exchanged for a transitional dilator, which allowed advancement of a [facility] guide wire centrally.  Over this, a 5-French Kumpe catheter was advanced for left lower extremity venography.  Venography demonstrated no thrombus in the popliteal vein, minimal residual nonocclusive thrombus in the superficial femoral vein. There is occlusive thrombus centrally in the common femoral vein through the left iliac venous system. There is minimal venous collateral drainage pathways.   The right common iliac vein and IVC remain widely patent.  The Kumpe was exchanged over guide wire for a 7-French vascular sheath, and a vert catheter was advanced and used with the aid of an angled glide wire to traverse the high grade obstructive lesion at the confluence of the left iliac vein and IVC.  The vert catheter was exchanged for the AngioJet catheter, and 10 mg TPA were instilled through the thrombus using pulse spray technique. The TPA was given 20 minutes for lysis.  The AngioJet catheter was then used as a mechanical thrombectomy device to extract residual thrombus from the common femoral and iliac veins.  Follow-up venography at this point demonstrates antegrade flow through the iliac venous system.  There is some residual nonocclusive thrombus in the external iliac vein.  There remains a high grade stenotic lesion at the confluence of the left common iliac vein with the IVC.  The 10 mm x 4 cm Conquest angioplasty balloon catheter was used to macerate thrombus through the iliac venous system.  This balloon would not however traverse the stenosis at the confluence with the IVC,  so a 7 mm x 4 cm Mustang angioplasty balloon was then used to traverse this lesion and partially dilate the lesion, which was then further dilated with the 10 mm Conquest balloon.  Follow-up venography at this point demonstrates better antegrade flow through the iliac venous system.  There is still significant irregularity and narrowing centrally in the common iliac vein near its confluence with the IVC.  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  This was dilated with a 12 mm Atlas angioplasty balloon catheter.  Residual mural thrombus in the external iliac vein was further macerated with the 12 mm Atlas angioplasty catheter.   Following venography showed some recoil through the left external iliac venous system to  the common femoral vein, but good antegrade flow with no significant collateral filling.  Given the increased potential risk of prolonged systemic thrombolysis due to the patient's recent postpartum state, no additional thrombolysis was pursued.  The catheter, sheath and guide wire were removed and hemostasis achieved at the site. The patient tolerated the procedure well.  No immediate complication.  IMPRESSION: 1.  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 2.  Occlusive acute thrombus through the left iliac venous system extending to the common femoral vein. 3.  Technically successful thrombolysis and mechanical thrombectomy of the acute left iliofemoral occlusive thrombus. 4.  Technically successful stent-assisted balloon angioplasty of the left common iliac vein stenosis, with restoration of good antegrade flow. 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 The patient should remain on therapeutic anticoagulation for at least 6 months if tolerated.   Original Report Authenticated By: Osa Craver, M.D.    Ir Rande Lawman F/u Eval Art/ven Final Day (ms)  10/19/2012  *RADIOLOGY REPORT*  Clinical data:  Symptomatic left lower extremity DVT.  19 days postpartum.  LEFT LOWER EXTREMITY VENOGRAPHY ULTRASOUND GUIDANCE FOR VENOUS ACCESS CATHETER DIRECTED THROMBOLYTIC INFUSION MECHANICAL THROMBECTOMY VENOUS ANGIOPLASTY VENOUS STENT PLACEMENT  Comparison:  None  Technique and findings: The procedure, risks (including but not limited to bleeding, infection, organ damage), benefits, and alternatives were explained to the patient.  Questions regarding the procedure were encouraged and answered.  The patient understands and consents to the procedure.  Intravenous Fentanyl and Versed were administered as conscious sedation during  continuous cardiorespiratory monitoring by the radiology RN, with a total moderate sedation time of 128 minutes.  The left posterior popliteal region was prepped draped usual sterile fashion. Maximal barrier sterile technique was utilized including caps, mask, sterile gowns, sterile gloves, sterile drape, hand hygiene and skin antiseptic.  Under real time ultrasound guidance, the left popliteal vein below the knee was accessed with a 21-gauge micropuncture needle in a single pass.  The guide wire advanced easily within the lumen of the vessel, position confirmed on fluoroscopy.  The needle was exchanged for a transitional dilator, which allowed advancement of a [facility] guide wire centrally.  Over this, a 5-French Kumpe catheter was advanced for left lower extremity venography.  Venography demonstrated no thrombus in the popliteal vein, minimal residual nonocclusive thrombus in the superficial femoral vein. There is occlusive thrombus centrally in the common femoral vein through the left iliac venous system. There is minimal venous collateral drainage pathways.  The right common iliac vein and IVC remain widely patent.  The Kumpe was exchanged over guide wire for a 7-French vascular sheath, and a vert catheter was advanced and used with the aid of an angled glide wire to traverse the high grade obstructive lesion at the confluence of the left iliac vein and IVC.  The vert catheter was exchanged for the AngioJet catheter, and 10 mg TPA were instilled through the thrombus using pulse spray technique. The TPA was given 20 minutes for lysis.  The AngioJet catheter was then used as a mechanical thrombectomy device to extract residual thrombus from the common femoral and iliac veins.  Follow-up venography at this point demonstrates antegrade flow through the iliac venous system.  There is some residual nonocclusive thrombus in the external iliac vein.  There remains a high grade stenotic lesion at the confluence of the left  common iliac vein with the IVC.  The 10 mm x 4 cm Conquest angioplasty balloon catheter was used to macerate thrombus through the iliac venous system.  This balloon would not however traverse the stenosis at the confluence with the IVC, so a 7 mm x 4 cm Mustang angioplasty balloon was then used to traverse this lesion and partially dilate the lesion, which was then further dilated with the 10 mm Conquest balloon.  Follow-up venography at this point demonstrates better antegrade flow through the iliac venous system.  There is still significant irregularity and narrowing centrally in the common iliac vein near its confluence with the IVC.  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  This was dilated with a 12 mm Atlas angioplasty balloon catheter.  Residual mural thrombus in the external iliac vein was further macerated with the 12 mm Atlas angioplasty catheter.  Following venography showed some recoil through the left external iliac venous system to  the common femoral vein, but good antegrade flow with no significant collateral filling.  Given the increased potential risk of prolonged systemic thrombolysis due to the patient's recent postpartum state, no additional thrombolysis was pursued.  The catheter, sheath and guide wire were removed and hemostasis achieved at the site. The patient tolerated the procedure well.  No immediate complication.  IMPRESSION: 1.  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 2.  Occlusive acute thrombus through the left iliac venous system extending to the common femoral vein. 3.  Technically successful thrombolysis and mechanical thrombectomy of the acute left iliofemoral occlusive thrombus. 4.  Technically successful  stent-assisted balloon angioplasty of the left common iliac vein stenosis, with restoration of good antegrade flow. 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 The patient should remain on therapeutic anticoagulation for at least 6 months if tolerated.   Original Report Authenticated By: Osa Craver, M.D.     Assessment/Plan: S/p Left venous thrombolysis and Left iliac vein stent/angioplasty. Doing well POD#1 Recommend compression stockings, ok to ambulate Cont hep gtt and rec at least 6 months po anticoagulation     LOS: 2 days    Brayton El PA-C 10/19/2012 9:04 AM

## 2012-10-19 NOTE — Discharge Summary (Signed)
DISCHARGE SUMMARY  [name]  MR#: [id number]  DOB:Jan 11, 1971  Date of Admission: 10/17/2012 Date of Discharge: 10/19/2012  Attending Physician:Saraann Enneking Butler Denmark, MD  Patient's WUJ:WJXBJYN,WGNFAOZH, MD  Consults:  Interventional Radiology Lactation consultant  Pertinent discharge information: After much discussion the patient opted to proceed with using Xarelto for anticoagulation with the knowledge that she will no longer be able to breast-feed.  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 Pregnancy was normal except for a subchorionic hemorrhagic clot during the first trimester found on ultrasound. She did experience preterm labor but otherwise had uneventful delivery and hospitalization. Discharged home with healthy infant and breast-feeding status. During the evaluation prior to discharge the obstetrician noted no lower extremity edema and no signs of DVT. Patient also had mild thrombocytopenia since September 2013 during the pregnancy with platelet count nadir of 97,000 noted 24 hours after delivery. From a historical perspective during the pregnancy she had some intermittent lower extremity swelling and shortness of breath. A d-dimer was obtained during the pregnancy which was mildly elevated but a CT of the chest performed May 2013 demonstrated no evidence of pulmonary embolism. In addition during the  pregnancy the patient was having uncontrolled tachycardia and underwent an echocardiogram which was negative. Because of the tachycardia she was placed on bed rest.  Several days prior to current admission patient began noticing left lower extremity swelling. When the edema in the lower extremity recurred the patient decided to elevate her leg and the edema resolved briefly but then returned and was much more significant and was associated with dependent rubra and duskiness. She contacted the obstetrician as well as the cardiologist who advised her to present to the emergency department for evaluation. In addition she was experiencing lower extremity numbness. She had no prior history of thrombus/DVT in the past nor with her previous pregnancies. Also no family history of coagulopathies or DVT. She was eventually evaluated by emergency room physician Dr. Hyacinth Meeker who became concerned over the degree of edema in her leg and performed a bedside ultrasound which revealed extensive clot in the left lower extremity. She was subsequently admitted to the step down unit for further monitoring and treatment   Hospital Course: Principal Problem:  *Postpartum deep vein thrombosis Active Problems:  Leg edema, left leg (severe)  Numbness in left leg  Thrombocytopenia  History of atrial tachycardia  Breast feeding status of mother  Postpartum deep vein thrombosis At time of admission she was evaluated at Dr. Darrick Penna with vascular surgery who felt that the extensive DVT of the left leg was not limb threatening but that she may be an appropriate candidate from probable lysis to prevent long-term effects of postphlebitic syndrome. In the interim she was started on IV heparin anticoagulation. Interventional radiology was consulted and the patient subsequently underwent transcatheter thrombolyzed this with subsequent venous angioplasty and placement of stent into the left iliac vein by interventional radiology. Patient was  stable within the first 24 hours after procedure and began to ambulate without  difficulty. She had marked decrease in edema of the left lower extremity and the previous numbness had resolved. Recommendations or for the patient to maintain systemic anticoagulation for at least 6 months. Prior to admission this patient was breast-feeding. Lactation consultant to evaluate the patient and noted that Coumadin is appropriate for use and breast-feeding mothers. Risks and benefits of Coumadin versus Xarelto were discussed with the patient by my attending physician Dr. Butler Denmark. After much thought and discussion with her husband the patient reluctantly decided to forego breast-feeding in the future. Instead she wishes to begin Xarelto which will allow immediate anticoagulation and therefore allow her to return home today. Patient was quite emotional when making this decision and I allowed her plenty of time to make sure this was the appropriate decision for her and once the decision was made emotional support was given. Prior to discharge the case manager gave the patient a coupon as well as a contact number to activate a lower co-pay for the Xarelto.   Thrombocytopenia Patient had documented thrombocytopenia since September 2013 while she was pregnant. At presentation platelets were normal at 183,000. Since initiation of anticoagulation patient's platelet count on heparin had decreased from 146,000-119,000. It is recommended the patient have a CBC at followup visit with internist after discharge.  Right axilla lump   Just prior to discharge the patient notified the nurse that she had a lump in her right axilla. She explained to the nurse at this area was present prior to admission and apparently had been present throughout most of her pregnancy. Informed the nurse to let the patient know that this will need to be followed up by either her an internist or her GYN after discharge. Either way given the fact she is  currently lactating this would limit the ability for an accurate breast ultrasound or mammography and those tests will only be performed in the outpatient setting at a local breast Center.    History of atrial tachycardia Heart rate has remained stable during the hospitalization and patient will resume her home Normodyne.  Day of Discharge BP 123/72  Pulse 81  Temp 98.6 F (37 C) (Oral)  Resp 14  Ht 5\' 8"  (1.727 m)  Wt 73 kg (160 lb 15 oz)  BMI 24.47 kg/m2  SpO2 98%  LMP 10/18/2012  Breastfeeding? Yes  Physical Exam: Gen: Alert, emotional at times when discussing possible need to discontinue breast-feeding, in no distress Lungs: Clear to auscultation bilaterally, on room air Heart: S1-S2 without rubs murmurs thrills or gallops, sinus rhythm, marked decrease in left lower extremity edema Abd: Soft nontender nondistended bowel sounds present, tolerating solid diet Musc-Skel: Extremities symmetrical without cyanosis or clubbing Neuro: Alert and oriented x3 moves all extremities x4, otherwise exam nonfocal  Radiology: Ir Veno/ext/uni Left  10/19/2012  *RADIOLOGY REPORT*  Clinical data:  Symptomatic left lower extremity DVT.  19 days postpartum.  LEFT LOWER EXTREMITY VENOGRAPHY ULTRASOUND GUIDANCE FOR VENOUS ACCESS CATHETER DIRECTED THROMBOLYTIC INFUSION MECHANICAL THROMBECTOMY VENOUS ANGIOPLASTY VENOUS STENT PLACEMENT  Comparison:  None  Technique and findings: The procedure, risks (including but not limited to bleeding, infection, organ damage), benefits, and alternatives were explained to the patient.  Questions regarding the procedure were encouraged and answered.  The patient understands and consents to the procedure.  Intravenous Fentanyl and Versed were administered as conscious sedation during continuous cardiorespiratory monitoring by the radiology RN, with a total moderate sedation time of 128 minutes.  The left posterior popliteal region was prepped draped usual sterile fashion.  Maximal barrier sterile technique was utilized including caps, mask, sterile gowns, sterile gloves, sterile drape, hand hygiene and skin antiseptic.  Under real time ultrasound guidance, the left popliteal vein below the knee was accessed with a 21-gauge micropuncture needle in a single pass.  The guide wire advanced easily within the lumen of the vessel, position confirmed on fluoroscopy.  The needle was exchanged for a transitional dilator, which allowed advancement of a [facility] guide wire centrally.  Over this, a 5-French Kumpe catheter was advanced for left lower extremity venography.  Venography demonstrated no thrombus in the popliteal vein, minimal residual nonocclusive thrombus in the superficial femoral vein. There is occlusive thrombus centrally in the common femoral vein through the left iliac venous system. There is minimal venous collateral drainage pathways.  The right common iliac vein and IVC remain widely patent.  The Kumpe was exchanged over guide wire for a 7-French vascular sheath, and a vert catheter was advanced and used with the aid of an angled glide wire to traverse the high grade obstructive lesion at the confluence of the left iliac vein and IVC.  The vert catheter was exchanged for the AngioJet catheter, and 10 mg TPA were instilled through the thrombus using pulse spray technique. The TPA was given 20 minutes for lysis.  The AngioJet catheter was then used as a mechanical thrombectomy device to extract residual thrombus from the common femoral and iliac veins.  Follow-up venography at this point demonstrates antegrade flow through the iliac venous system.  There is some residual nonocclusive thrombus in the external iliac vein.  There remains a high grade stenotic lesion at the confluence of the left common iliac vein with the IVC.  The 10 mm x 4 cm Conquest angioplasty balloon catheter was used to macerate thrombus through the iliac venous system.  This balloon would not however traverse  the stenosis at the confluence with the IVC, so a 7 mm x 4 cm Mustang angioplasty balloon was then used to traverse this lesion and partially dilate the lesion, which was then further dilated with the 10 mm Conquest balloon.  Follow-up venography at this point demonstrates better antegrade flow through the iliac venous system.  There is still significant irregularity and narrowing centrally in the common iliac vein near its confluence with the IVC.  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  This was dilated with a 12 mm Atlas angioplasty balloon catheter.  Residual mural thrombus in the external iliac vein was further macerated with the 12 mm Atlas angioplasty catheter.  Following venography showed some recoil through the left external iliac venous system to  the common femoral vein, but good antegrade flow with no significant collateral filling.  Given the increased potential risk of prolonged systemic thrombolysis due to the patient's recent postpartum state, no additional thrombolysis was pursued.  The catheter, sheath and guide wire were removed and hemostasis achieved at the site. The patient tolerated the procedure well.  No immediate complication.  IMPRESSION: 1.  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 2.  Occlusive acute thrombus through the left iliac venous system extending to the common femoral vein. 3.  Technically successful thrombolysis and mechanical thrombectomy of the acute left iliofemoral occlusive thrombus.  4.  Technically successful stent-assisted balloon angioplasty of the left common iliac vein stenosis, with restoration of good antegrade flow. 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 The  patient should remain on therapeutic anticoagulation for at least 6 months if tolerated.   Original Report Authenticated By: Osa Craver, M.D.    Ir Pta Venous Left  10/19/2012  *RADIOLOGY REPORT*  Clinical data:  Symptomatic left lower extremity DVT.  19 days postpartum.  LEFT LOWER EXTREMITY VENOGRAPHY ULTRASOUND GUIDANCE FOR VENOUS ACCESS CATHETER DIRECTED THROMBOLYTIC INFUSION MECHANICAL THROMBECTOMY VENOUS ANGIOPLASTY VENOUS STENT PLACEMENT  Comparison:  None  Technique and findings: The procedure, risks (including but not limited to bleeding, infection, organ damage), benefits, and alternatives were explained to the patient.  Questions regarding the procedure were encouraged and answered.  The patient understands and consents to the procedure.  Intravenous Fentanyl and Versed were administered as conscious sedation during continuous cardiorespiratory monitoring by the radiology RN, with a total moderate sedation time of 128 minutes.  The left posterior popliteal region was prepped draped usual sterile fashion. Maximal barrier sterile technique was utilized including caps, mask, sterile gowns, sterile gloves, sterile drape, hand hygiene and skin antiseptic.  Under real time ultrasound guidance, the left popliteal vein below the knee was accessed with a 21-gauge micropuncture needle in a single pass.  The guide wire advanced easily within the lumen of the vessel, position confirmed on fluoroscopy.  The needle was exchanged for a transitional dilator, which allowed advancement of a [facility] guide wire centrally.  Over this, a 5-French Kumpe catheter was advanced for left lower extremity venography.  Venography demonstrated no thrombus in the popliteal vein, minimal residual nonocclusive thrombus in the superficial femoral vein. There is occlusive thrombus centrally in the common femoral vein through the left iliac venous system. There is minimal venous collateral drainage pathways.  The right common  iliac vein and IVC remain widely patent.  The Kumpe was exchanged over guide wire for a 7-French vascular sheath, and a vert catheter was advanced and used with the aid of an angled glide wire to traverse the high grade obstructive lesion at the confluence of the left iliac vein and IVC.  The vert catheter was exchanged for the AngioJet catheter, and 10 mg TPA were instilled through the thrombus using pulse spray technique. The TPA was given 20 minutes for lysis.  The AngioJet catheter was then used as a mechanical thrombectomy device to extract residual thrombus from the common femoral and iliac veins.  Follow-up venography at this point demonstrates antegrade flow through the iliac venous system.  There is some residual nonocclusive thrombus in the external iliac vein.  There remains a high grade stenotic lesion at the confluence of the left common iliac vein with the IVC.  The 10 mm x 4 cm Conquest angioplasty balloon catheter was used to macerate thrombus through the iliac venous system.  This balloon would not however traverse the stenosis at the confluence with the IVC, so a 7 mm x 4 cm Mustang angioplasty balloon was then used to traverse this lesion and partially dilate the lesion, which was then further dilated with the 10 mm Conquest balloon.  Follow-up venography at this point demonstrates better antegrade flow through the iliac venous system.  There is still significant irregularity and narrowing centrally in the common iliac vein near its confluence with the IVC.  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  Given this appearance  consistent with May-Thurner syndrome, a 14 mm x 80 mm Smart stent was deployed in the left common iliac vein across its confluence with the IVC.  This was dilated with a 12 mm Atlas angioplasty balloon catheter.  Residual mural thrombus in the external iliac vein was further macerated with the 12 mm Atlas angioplasty catheter.  Following  venography showed some recoil through the left external iliac venous system to  the common femoral vein, but good antegrade flow with no significant collateral filling.  Given the increased potential risk of prolonged systemic thrombolysis due to the patient's recent postpartum state, no additional thrombolysis was pursued.  The catheter, sheath and guide wire were removed and hemostasis achieved at the site. The patient tolerated the procedure well.  No immediate complication.  IMPRESSION: 1.  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 2.  Occlusive acute thrombus through the left iliac venous system extending to the common femoral vein. 3.  Technically successful thrombolysis and mechanical thrombectomy of the acute left iliofemoral occlusive thrombus. 4.  Technically successful stent-assisted balloon angioplasty of the left common iliac vein stenosis, with restoration of good antegrade flow. 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 The patient should remain on therapeutic anticoagulation for at least 6 months if tolerated.   Original Report Authenticated By: Osa Craver, M.D.    Ir Thrombect Veno Mech Mod Sed  10/19/2012  *RADIOLOGY REPORT*  Clinical data:  Symptomatic left lower extremity DVT.  19 days postpartum.  LEFT LOWER EXTREMITY VENOGRAPHY ULTRASOUND GUIDANCE FOR VENOUS ACCESS CATHETER DIRECTED THROMBOLYTIC INFUSION MECHANICAL THROMBECTOMY VENOUS ANGIOPLASTY VENOUS STENT PLACEMENT  Comparison:  None  Technique and findings: The procedure, risks (including but not limited to bleeding, infection, organ damage), benefits, and alternatives were explained to the patient.  Questions regarding the procedure were encouraged and answered.  The patient understands and consents to the procedure.  Intravenous Fentanyl and Versed were administered as conscious sedation during continuous cardiorespiratory  monitoring by the radiology RN, with a total moderate sedation time of 128 minutes.  The left posterior popliteal region was prepped draped usual sterile fashion. Maximal barrier sterile technique was utilized including caps, mask, sterile gowns, sterile gloves, sterile drape, hand hygiene and skin antiseptic.  Under real time ultrasound guidance, the left popliteal vein below the knee was accessed with a 21-gauge micropuncture needle in a single pass.  The guide wire advanced easily within the lumen of the vessel, position confirmed on fluoroscopy.  The needle was exchanged for a transitional dilator, which allowed advancement of a [facility] guide wire centrally.  Over this, a 5-French Kumpe catheter was advanced for left lower extremity venography.  Venography demonstrated no thrombus in the popliteal vein, minimal residual nonocclusive thrombus in the superficial femoral vein. There is occlusive thrombus centrally in the common femoral vein through the left iliac venous system. There is minimal venous collateral drainage pathways.  The right common iliac vein and IVC remain widely patent.  The Kumpe was exchanged over guide wire for a 7-French vascular sheath, and a vert catheter was advanced and used with the aid of an angled glide wire to traverse the high grade obstructive lesion at the confluence of the left iliac vein and IVC.  The vert catheter was exchanged for the AngioJet catheter, and 10 mg TPA were instilled through the thrombus using pulse spray technique. The TPA was given 20 minutes for lysis.  The  AngioJet catheter was then used as a mechanical thrombectomy device to extract residual thrombus from the common femoral and iliac veins.  Follow-up venography at this point demonstrates antegrade flow through the iliac venous system.  There is some residual nonocclusive thrombus in the external iliac vein.  There remains a high grade stenotic lesion at the confluence of the left common iliac vein with the  IVC.  The 10 mm x 4 cm Conquest angioplasty balloon catheter was used to macerate thrombus through the iliac venous system.  This balloon would not however traverse the stenosis at the confluence with the IVC, so a 7 mm x 4 cm Mustang angioplasty balloon was then used to traverse this lesion and partially dilate the lesion, which was then further dilated with the 10 mm Conquest balloon.  Follow-up venography at this point demonstrates better antegrade flow through the iliac venous system.  There is still significant irregularity and narrowing centrally in the common iliac vein near its confluence with the IVC.  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  This was dilated with a 12 mm Atlas angioplasty balloon catheter.  Residual mural thrombus in the external iliac vein was further macerated with the 12 mm Atlas angioplasty catheter.  Following venography showed some recoil through the left external iliac venous system to  the common femoral vein, but good antegrade flow with no significant collateral filling.  Given the increased potential risk of prolonged systemic thrombolysis due to the patient's recent postpartum state, no additional thrombolysis was pursued.  The catheter, sheath and guide wire were removed and hemostasis achieved at the site. The patient tolerated the procedure well.  No immediate complication.  IMPRESSION: 1.  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 2.  Occlusive acute thrombus through the left iliac venous system extending to the common femoral vein. 3.  Technically successful thrombolysis and mechanical thrombectomy of the acute left iliofemoral occlusive thrombus. 4.  Technically successful stent-assisted balloon  angioplasty of the left common iliac vein stenosis, with restoration of good antegrade flow. 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 The patient should remain on therapeutic anticoagulation for at least 6 months if tolerated.   Original Report Authenticated By: Osa Craver, M.D.    Ir US Guide Vasc Access Left  10/19/2012  *RADIOLOGY REPORT*  Clinical data:  Symptomatic left lower extremity DVT.  19 days postpartum.  LEFT LOWER EXTREMITY VENOGRAPHY ULTRASOUND GUIDANCE FOR VENOUS ACCESS CATHETER DIRECTED THROMBOLYTIC INFUSION MECHANICAL THROMBECTOMY VENOUS ANGIOPLASTY VENOUS STENT PLACEMENT  Comparison:  None  Technique and findings: The procedure, risks (including but not limited to bleeding, infection, organ damage), benefits, and alternatives were explained to the patient.  Questions regarding the procedure were encouraged and answered.  The patient understands and consents to the procedure.  Intravenous Fentanyl and Versed were administered as conscious sedation during continuous cardiorespiratory monitoring by the radiology RN, with a total moderate sedation time of 128 minutes.  The left posterior popliteal region was prepped draped usual sterile fashion. Maximal barrier sterile technique was utilized including caps, mask, sterile gowns, sterile gloves, sterile drape, hand hygiene and skin antiseptic.  Under real time ultrasound guidance, the left popliteal vein below the knee  was accessed with a 21-gauge micropuncture needle in a single pass.  The guide wire advanced easily within the lumen of the vessel, position confirmed on fluoroscopy.  The needle was exchanged for a transitional dilator, which allowed advancement of a [facility] guide wire centrally.  Over this, a 5-French Kumpe catheter was advanced for left lower extremity venography.  Venography demonstrated no thrombus in the popliteal vein, minimal residual nonocclusive thrombus in the superficial femoral  vein. There is occlusive thrombus centrally in the common femoral vein through the left iliac venous system. There is minimal venous collateral drainage pathways.  The right common iliac vein and IVC remain widely patent.  The Kumpe was exchanged over guide wire for a 7-French vascular sheath, and a vert catheter was advanced and used with the aid of an angled glide wire to traverse the high grade obstructive lesion at the confluence of the left iliac vein and IVC.  The vert catheter was exchanged for the AngioJet catheter, and 10 mg TPA were instilled through the thrombus using pulse spray technique. The TPA was given 20 minutes for lysis.  The AngioJet catheter was then used as a mechanical thrombectomy device to extract residual thrombus from the common femoral and iliac veins.  Follow-up venography at this point demonstrates antegrade flow through the iliac venous system.  There is some residual nonocclusive thrombus in the external iliac vein.  There remains a high grade stenotic lesion at the confluence of the left common iliac vein with the IVC.  The 10 mm x 4 cm Conquest angioplasty balloon catheter was used to macerate thrombus through the iliac venous system.  This balloon would not however traverse the stenosis at the confluence with the IVC, so a 7 mm x 4 cm Mustang angioplasty balloon was then used to traverse this lesion and partially dilate the lesion, which was then further dilated with the 10 mm Conquest balloon.  Follow-up venography at this point demonstrates better antegrade flow through the iliac venous system.  There is still significant irregularity and narrowing centrally in the common iliac vein near its confluence with the IVC.  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  This was dilated with  a 12 mm Atlas angioplasty balloon catheter.  Residual mural thrombus in the external iliac vein was further macerated with the 12 mm Atlas angioplasty catheter.  Following venography showed some recoil through the left external iliac venous system to  the common femoral vein, but good antegrade flow with no significant collateral filling.  Given the increased potential risk of prolonged systemic thrombolysis due to the patient's recent postpartum state, no additional thrombolysis was pursued.  The catheter, sheath and guide wire were removed and hemostasis achieved at the site. The patient tolerated the procedure well.  No immediate complication.  IMPRESSION: 1.  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 2.  Occlusive acute thrombus through the left iliac venous system extending to the common femoral vein. 3.  Technically successful thrombolysis and mechanical thrombectomy of the acute left iliofemoral occlusive thrombus. 4.  Technically successful stent-assisted balloon angioplasty of the left common iliac vein stenosis, with restoration of good antegrade flow. 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 The  patient should remain on therapeutic anticoagulation for at least 6 months if tolerated.   Original Report Authenticated By: Osa Craver, M.D.    Ir Rande Lawman F/u Eval Art/ven Final Day (ms)  10/19/2012  *RADIOLOGY REPORT*  Clinical data:  Symptomatic left lower extremity DVT.  19 days postpartum.  LEFT LOWER EXTREMITY VENOGRAPHY ULTRASOUND GUIDANCE FOR VENOUS ACCESS CATHETER DIRECTED THROMBOLYTIC INFUSION MECHANICAL THROMBECTOMY VENOUS ANGIOPLASTY VENOUS STENT PLACEMENT  Comparison:  None  Technique and findings: The procedure, risks (including but not limited to bleeding, infection, organ damage), benefits, and alternatives were explained to the patient.  Questions regarding the procedure were  encouraged and answered.  The patient understands and consents to the procedure.  Intravenous Fentanyl and Versed were administered as conscious sedation during continuous cardiorespiratory monitoring by the radiology RN, with a total moderate sedation time of 128 minutes.  The left posterior popliteal region was prepped draped usual sterile fashion. Maximal barrier sterile technique was utilized including caps, mask, sterile gowns, sterile gloves, sterile drape, hand hygiene and skin antiseptic.  Under real time ultrasound guidance, the left popliteal vein below the knee was accessed with a 21-gauge micropuncture needle in a single pass.  The guide wire advanced easily within the lumen of the vessel, position confirmed on fluoroscopy.  The needle was exchanged for a transitional dilator, which allowed advancement of a [facility] guide wire centrally.  Over this, a 5-French Kumpe catheter was advanced for left lower extremity venography.  Venography demonstrated no thrombus in the popliteal vein, minimal residual nonocclusive thrombus in the superficial femoral vein. There is occlusive thrombus centrally in the common femoral vein through the left iliac venous system. There is minimal venous collateral drainage pathways.  The right common iliac vein and IVC remain widely patent.  The Kumpe was exchanged over guide wire for a 7-French vascular sheath, and a vert catheter was advanced and used with the aid of an angled glide wire to traverse the high grade obstructive lesion at the confluence of the left iliac vein and IVC.  The vert catheter was exchanged for the AngioJet catheter, and 10 mg TPA were instilled through the thrombus using pulse spray technique. The TPA was given 20 minutes for lysis.  The AngioJet catheter was then used as a mechanical thrombectomy device to extract residual thrombus from the common femoral and iliac veins.  Follow-up venography at this point demonstrates antegrade flow through the iliac  venous system.  There is some residual nonocclusive thrombus in the external iliac vein.  There remains a high grade stenotic lesion at the confluence of the left common iliac vein with the IVC.  The 10 mm x 4 cm Conquest angioplasty balloon catheter was used to macerate thrombus through the iliac venous system.  This balloon would not however traverse the stenosis at the confluence with the IVC, so a 7 mm x 4 cm Mustang angioplasty balloon was then used to traverse this lesion and partially dilate the lesion, which was then further dilated with the 10 mm Conquest balloon.  Follow-up venography at this point demonstrates better antegrade flow through the iliac venous system.  There is still significant irregularity and narrowing centrally in the common iliac vein near its confluence with the IVC.  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  This was dilated with a 12 mm Atlas angioplasty balloon catheter.  Residual mural thrombus in the external iliac vein was further macerated with the 12 mm Atlas angioplasty catheter.  Following venography showed some recoil through the left external iliac venous system to  the common femoral vein, but good antegrade flow with no significant collateral filling.  Given the increased potential risk of prolonged systemic thrombolysis due to the patient's recent postpartum state, no additional thrombolysis was pursued.  The catheter, sheath and guide wire were removed and hemostasis achieved at the site. The patient tolerated the procedure well.  No immediate complication.  IMPRESSION: 1.  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 2.  Occlusive acute thrombus through the left iliac venous system extending to the  common femoral vein. 3.  Technically successful thrombolysis and mechanical thrombectomy of the acute left iliofemoral occlusive thrombus. 4.  Technically successful stent-assisted balloon angioplasty of the left common iliac vein stenosis, with restoration of good antegrade flow. 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 The patient should remain on therapeutic anticoagulation for at least 6 months if tolerated.   Original Report Authenticated By: Osa Craver, M.D.     Laboratory: Results for orders placed during the hospital encounter of 10/17/12 (from the past 48 hour(s))  CBC     Status: Normal   Collection Time   10/17/12  8:00 PM      Component Value Range Comment   WBC 5.8  4.0 - 10.5 K/uL    RBC 4.91  3.87 - 5.11 MIL/uL    Hemoglobin 14.9  12.0 - 15.0 g/dL    HCT 96.2  95.2 - 84.1 %    MCV 89.0  78.0 - 100.0 fL    MCH 30.3  26.0 - 34.0 pg    MCHC 34.1  30.0 - 36.0 g/dL    RDW 32.4  40.1 - 02.7 %    Platelets 183  150 - 400 K/uL   BASIC METABOLIC PANEL     Status: Abnormal   Collection Time   10/17/12  8:00 PM      Component Value Range Comment   Sodium 139  135 - 145 mEq/L    Potassium 4.0  3.5 - 5.1 mEq/L    Chloride 105  96 - 112 mEq/L    CO2 25  19 - 32 mEq/L    Glucose, Bld 94  70 - 99 mg/dL    BUN 14  6 - 23 mg/dL    Creatinine, Ser 2.53  0.50 - 1.10 mg/dL    Calcium 9.3  8.4 - 66.4 mg/dL    GFR calc non Af Amer 78 (*) >90 mL/min    GFR calc Af Amer >90  >90 mL/min   APTT     Status: Normal   Collection Time   10/18/12  1:18 AM      Component Value Range Comment   aPTT 27  24 - 37 seconds   PROTIME-INR     Status: Normal   Collection Time   10/18/12  1:18 AM      Component Value Range Comment   Prothrombin Time 15.0  11.6 - 15.2 seconds    INR 1.20  0.00 - 1.49   ANTITHROMBIN III     Status: Normal   Collection Time   10/18/12  2:52 AM      Component Value Range Comment  AntiThromb III Func 95  75 - 120 %     HOMOCYSTEINE     Status: Normal   Collection Time   10/18/12  2:52 AM      Component Value Range Comment   Homocysteine 10.2  4.0 - 15.4 umol/L   MRSA PCR SCREENING     Status: Normal   Collection Time   10/18/12  4:34 AM      Component Value Range Comment   MRSA by PCR NEGATIVE  NEGATIVE   HEPARIN LEVEL (UNFRACTIONATED)     Status: Normal   Collection Time   10/18/12 10:06 AM      Component Value Range Comment   Heparin Unfractionated 0.35  0.30 - 0.70 IU/mL   PROTIME-INR     Status: Normal   Collection Time   10/18/12 10:06 AM      Component Value Range Comment   Prothrombin Time 15.0  11.6 - 15.2 seconds    INR 1.20  0.00 - 1.49   CBC     Status: Abnormal   Collection Time   10/18/12 10:06 AM      Component Value Range Comment   WBC 6.6  4.0 - 10.5 K/uL    RBC 4.76  3.87 - 5.11 MIL/uL    Hemoglobin 14.5  12.0 - 15.0 g/dL    HCT 16.1  09.6 - 04.5 %    MCV 88.4  78.0 - 100.0 fL    MCH 30.5  26.0 - 34.0 pg    MCHC 34.4  30.0 - 36.0 g/dL    RDW 40.9  81.1 - 91.4 %    Platelets 146 (*) 150 - 400 K/uL   BASIC METABOLIC PANEL     Status: Normal   Collection Time   10/18/12 10:06 AM      Component Value Range Comment   Sodium 140  135 - 145 mEq/L    Potassium 3.9  3.5 - 5.1 mEq/L    Chloride 109  96 - 112 mEq/L    CO2 22  19 - 32 mEq/L    Glucose, Bld 94  70 - 99 mg/dL    BUN 14  6 - 23 mg/dL    Creatinine, Ser 7.82  0.50 - 1.10 mg/dL    Calcium 9.2  8.4 - 95.6 mg/dL    GFR calc non Af Amer >90  >90 mL/min    GFR calc Af Amer >90  >90 mL/min   FIBRINOGEN     Status: Normal   Collection Time   10/18/12 10:06 AM      Component Value Range Comment   Fibrinogen 264  204 - 475 mg/dL   HEPARIN LEVEL (UNFRACTIONATED)     Status: Normal   Collection Time   10/18/12  8:48 PM      Component Value Range Comment   Heparin Unfractionated 0.31  0.30 - 0.70 IU/mL   HEPARIN LEVEL (UNFRACTIONATED)     Status: Normal   Collection Time   10/19/12  5:24 AM      Component Value  Range Comment   Heparin Unfractionated 0.37  0.30 - 0.70 IU/mL   BASIC METABOLIC PANEL     Status: Abnormal   Collection Time   10/19/12  5:24 AM      Component Value Range Comment   Sodium 140  135 - 145 mEq/L    Potassium 3.7  3.5 - 5.1 mEq/L    Chloride 110  96 - 112 mEq/L    CO2  23  19 - 32 mEq/L    Glucose, Bld 87  70 - 99 mg/dL    BUN 12  6 - 23 mg/dL    Creatinine, Ser 1.61  0.50 - 1.10 mg/dL    Calcium 8.3 (*) 8.4 - 10.5 mg/dL    GFR calc non Af Amer 88 (*) >90 mL/min    GFR calc Af Amer >90  >90 mL/min   CBC     Status: Abnormal   Collection Time   10/19/12  5:24 AM      Component Value Range Comment   WBC 5.3  4.0 - 10.5 K/uL    RBC 4.34  3.87 - 5.11 MIL/uL    Hemoglobin 13.0  12.0 - 15.0 g/dL    HCT 09.6  04.5 - 40.9 %    MCV 88.9  78.0 - 100.0 fL    MCH 30.0  26.0 - 34.0 pg    MCHC 33.7  30.0 - 36.0 g/dL    RDW 81.1  91.4 - 78.2 %    Platelets 119 (*) 150 - 400 K/uL PLATELET COUNT CONFIRMED BY SMEAR       Medication List     As of 10/19/2012  2:16 PM    STOP taking these medications         ibuprofen 200 MG tablet   Commonly known as: ADVIL,MOTRIN      TAKE these medications         cetirizine 10 MG tablet   Commonly known as: ZYRTEC   Take 10 mg by mouth daily.      labetalol 100 MG tablet   Commonly known as: NORMODYNE   Take 2 tablets (200 mg total) by mouth 2 (two) times daily.      ranitidine 75 MG tablet   Commonly known as: ZANTAC   Take 150 mg by mouth 2 (two) times daily.      Rivaroxaban 15 MG Tabs tablet   Commonly known as: XARELTO   Take 1 tablet (15 mg total) by mouth 2 (two) times daily.      Rivaroxaban 20 MG Tabs   Commonly known as: XARELTO   Take 1 tablet (20 mg total) by mouth daily.   Start taking on: 11/09/2012      Follow-up: Patient plans to establish with an internist at [facility] family [facility] with [employer]. The physician name she gave [place] was Dr. Kerin Perna  Disposition:  Discharge to  home with husband and infant    Junious Silk, ANP pager [phone]  I have examined the patient, reviewed the chart and modified the above d/c summary and agree with it.   Calvert Cantor, MD [phone]

## 2012-10-19 NOTE — Care Management Note (Signed)
    Page 1 of 1   10/19/2012     2:51:00 PM   CARE MANAGEMENT NOTE 10/19/2012  Patient:  [name], [name]   Account Number:  [id number]  Date Initiated:  10/18/2012  Documentation initiated by:  Junius Creamer  Subjective/Objective Assessment:   adm w dvt     Action/Plan:   lives w husb and childreb   Anticipated DC Date:  10/19/2012   Anticipated DC Plan:  HOME/SELF CARE      DC Planning Services  CM consult      Choice offered to / List presented to:             Status of service:   Medicare Important Message given?   (If response is "NO", the following Medicare IM given date fields will be blank) Date Medicare IM given:   Date Additional Medicare IM given:    Discharge Disposition:  HOME/SELF CARE  Per UR Regulation:  Reviewed for med. necessity/level of care/duration of stay  If discussed at Long Length of Stay Meetings, dates discussed:    Comments:  10/22 14:49p debbie Toya Palacios rn,bsn have activated copay assist card for xarelto so she can get for 10.00 per month. her copay w card w [facility] would be 50.00.   10/21 11am debbie [place] Delauder rn,bsn [phone]

## 2012-10-20 LAB — HOMOCYSTEINE: Homocysteine: 10.1 umol/L (ref 4.0–15.4)

## 2012-10-20 LAB — BETA-2-GLYCOPROTEIN I ABS, IGG/M/A
Beta-2 Glyco I IgG: 15 G Units (ref <20)
Beta-2 Glyco I IgG: 14 G Units (ref <20)
Beta-2-Glycoprotein I IgA: 2 A Units (ref <20)

## 2012-10-20 LAB — LUPUS ANTICOAGULANT PANEL
DRVVT: 29.8 secs (ref <42.9)
PTT Lupus Anticoagulant: 76.9 secs — ABNORMAL HIGH (ref 28.0–43.0)
PTTLA 4:1 Mix: 74 secs — ABNORMAL HIGH (ref 28.0–43.0)

## 2012-10-20 LAB — FACTOR 5 LEIDEN

## 2012-10-20 LAB — CARDIOLIPIN ANTIBODIES, IGG, IGM, IGA
Anticardiolipin IgA: 2 APL U/mL — ABNORMAL LOW (ref <22)
Anticardiolipin IgA: 2 APL U/mL — ABNORMAL LOW (ref <22)
Anticardiolipin IgM: 7 MPL U/mL — ABNORMAL LOW (ref <11)

## 2012-10-20 LAB — PROTEIN C, TOTAL: Protein C, Total: 77 % (ref 72–160)

## 2012-10-21 LAB — FACTOR 5 LEIDEN

## 2012-10-27 ENCOUNTER — Encounter: Payer: Self-pay | Admitting: Internal Medicine

## 2012-10-27 ENCOUNTER — Ambulatory Visit (INDEPENDENT_AMBULATORY_CARE_PROVIDER_SITE_OTHER): Payer: BC Managed Care – PPO | Admitting: Internal Medicine

## 2012-10-27 VITALS — BP 100/80 | HR 82 | Ht 68.0 in | Wt 154.0 lb

## 2012-10-27 DIAGNOSIS — I82409 Acute embolism and thrombosis of unspecified deep veins of unspecified lower extremity: Secondary | ICD-10-CM

## 2012-10-27 DIAGNOSIS — R000 Tachycardia, unspecified: Secondary | ICD-10-CM

## 2012-10-27 DIAGNOSIS — R002 Palpitations: Secondary | ICD-10-CM

## 2012-10-27 MED ORDER — ATENOLOL 25 MG PO TABS: 25.0000 mg | ORAL_TABLET | Freq: Two times a day (BID) | ORAL | Status: DC

## 2012-10-27 NOTE — Assessment & Plan Note (Signed)
Stable As she is not breast feeding, I will stop labetalol 200mg  BID and restart her prior regimen of atenolol 25mg  BID She will contact me if her symptoms worsen

## 2012-10-27 NOTE — Assessment & Plan Note (Signed)
Recently s/p post partum DVT.  She is appropriately anticoagulated with xarelto. She has follow-up with Dr Cyndie Chime in the next few weeks.   No changes today.

## 2012-10-27 NOTE — Patient Instructions (Addendum)
Your physician recommends that you schedule a follow-up appointment in: 2 months with Dr Johney Frame  Your physician has recommended you make the following change in your medication:  1) Stop Labetolol 2) Start Atenolol 25mg  twice daily

## 2012-10-27 NOTE — Progress Notes (Signed)
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  She recently gave birth to her son.  Her SOB has much improved since that time.  Her tachypalpitations are also better.   She had a post partum DVT diagnosed for which she underwent urgent intervention and has subsequently been placed on xarelto.  She denies any chest pain, PND, orthopnea, dizziness, or syncope.     The patient is otherwise without complaint today.   Past Medical History  Diagnosis Date  . Inappropriate sinus tachycardia   . Seasonal allergies   . Palpitations   . Fatigue   DVT  Past Surgical History  Procedure Date  . Wisdom tooth extraction     Current Outpatient Prescriptions  Medication Sig Dispense Refill  . cetirizine (ZYRTEC) 10 MG tablet Take 10 mg by mouth daily.      . ranitidine (ZANTAC) 75 MG tablet Take 150 mg by mouth 2 (two) times daily.      . Rivaroxaban (XARELTO) 15 MG TABS tablet Take 1 tablet (15 mg total) by mouth 2 (two) times daily.  42 tablet  0  . atenolol (TENORMIN) 25 MG tablet Take 1 tablet (25 mg total) by mouth 2 (two) times daily.  180 tablet  3  . cephALEXin (KEFLEX) 500 MG capsule       . Rivaroxaban (XARELTO) 20 MG TABS Take 1 tablet (20 mg total) by mouth daily.  30 tablet  6    Physical Exam: Filed Vitals:   10/27/12 1119  BP: 100/80  Pulse: 82  Height: 5\' 8"  (1.727 m)  Weight: 154 lb (69.854 kg)    GEN- The patient is well appearing, alert and oriented x 3 today.   Head- normocephalic, atraumatic Eyes-  Sclera clear, conjunctiva pink Ears- hearing intact Oropharynx- clear Lungs- Clear to ausculation bilaterally, normal work of breathing Heart- Regular rate and rhythm, no murmurs, rubs or gallops, PMI not laterally displaced GI- soft, NT, ND, + BS Extremities- no clubbing, cyanosis, or edema  ekg today reveals sinus rhythm 82 bpm  Assessment and Plan:

## 2012-11-10 ENCOUNTER — Ambulatory Visit (HOSPITAL_BASED_OUTPATIENT_CLINIC_OR_DEPARTMENT_OTHER): Payer: BC Managed Care – PPO | Admitting: Oncology

## 2012-11-10 ENCOUNTER — Telehealth: Payer: Self-pay | Admitting: Oncology

## 2012-11-10 ENCOUNTER — Encounter: Payer: Self-pay | Admitting: Oncology

## 2012-11-10 ENCOUNTER — Ambulatory Visit (HOSPITAL_BASED_OUTPATIENT_CLINIC_OR_DEPARTMENT_OTHER): Payer: BC Managed Care – PPO | Admitting: Lab

## 2012-11-10 ENCOUNTER — Other Ambulatory Visit: Payer: BC Managed Care – PPO | Admitting: Lab

## 2012-11-10 ENCOUNTER — Ambulatory Visit (HOSPITAL_BASED_OUTPATIENT_CLINIC_OR_DEPARTMENT_OTHER): Payer: BC Managed Care – PPO

## 2012-11-10 VITALS — BP 120/73 | HR 73 | Temp 98.2°F | Resp 20 | Ht 66.5 in | Wt 158.1 lb

## 2012-11-10 DIAGNOSIS — I824Y9 Acute embolism and thrombosis of unspecified deep veins of unspecified proximal lower extremity: Secondary | ICD-10-CM

## 2012-11-10 DIAGNOSIS — I82409 Acute embolism and thrombosis of unspecified deep veins of unspecified lower extremity: Secondary | ICD-10-CM

## 2012-11-10 LAB — CBC & DIFF AND RETIC
Eosinophils Absolute: 0.1 10*3/uL (ref 0.0–0.5)
Immature Retic Fract: 4.3 % (ref 1.60–10.00)
MONO#: 0.3 10*3/uL (ref 0.1–0.9)
NEUT#: 2.4 10*3/uL (ref 1.5–6.5)
Platelets: 132 10*3/uL — ABNORMAL LOW (ref 145–400)
RBC: 4.92 10*6/uL (ref 3.70–5.45)
RDW: 12.3 % (ref 11.2–14.5)
Retic %: 1.6 % (ref 0.70–2.10)
Retic Ct Abs: 78.72 10*3/uL (ref 33.70–90.70)
WBC: 4.6 10*3/uL (ref 3.9–10.3)
lymph#: 1.9 10*3/uL (ref 0.9–3.3)
nRBC: 0 % (ref 0–0)

## 2012-11-10 LAB — MORPHOLOGY

## 2012-11-10 NOTE — Patient Instructions (Signed)
Continue Xarelto for 6 months No elective surgery for 6 months IUD or progesterone based contraception OK Follow up with Hematologist in 3 months

## 2012-11-10 NOTE — Progress Notes (Signed)
New Patient Hematology-Oncology Evaluation   [name] [id number] [birth year] 41 y.o. 11/10/2012  CC: Dr. Candice Camp; Dr. Hillis Range; Dr. Carole Civil; Dr. Kerin Perna   Reason for referral: Advice on long-term anticoagulation in a young woman who recently had an extensive left pelvic DVT   HPI:  41 year old woman who has been in overall excellent health without any major medical or surgical illness except for what has been diagnosed as "inappropriate sinus tachycardia" for which she takes a beta blocker. Tachycardia and palpitations date as far back as 2002.  She was at approximately week 20 of her third pregnancy when she started to develop intermittent dyspnea  with chest pressure but no pleuritic chest pain. CT angiogram of the chest was done on 05/18/2012 and did not show any evidence for pulmonary emboli. She continued to have intermittent symptoms. She then began to develop intermittent swelling of her left calf. She also began to have intermittent pain in the left buttock radiating down the back of her left leg. She went on to have a normal, uncomplicated, vaginal delivery on October 2. A transient fall in her platelet count down to 97,000 noted at discharge.  She developed marked swelling of her left lower extremity with cyanotic changes and was advised to go to the emergency department on October 20. Venous Doppler studies of the lower extremity showed extensive acute thrombus in the left common femoral and, profunda, popliteal, posterior tibial, peroneal, and at the saphenofemoral junction. Due to the extent of the clot and what sounds like an early compartment syndrome, she was evaluated by interventional radiology. On October 21 she underwent left lower extremity venogram with catheter directed thrombolytic therapy, mechanical thrombectomy, venous angioplasty, and placement of a venous stent. 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 Occlusive acute thrombus seen throughout the left iliac venous system extending to the common femoral vein. The procedure was successful. There was some chronic residual thrombus in the left external iliac vein following thrombolysis. At least 6 months of anticoagulation was recommended and I certainly agree with this.  Although the leg does not feel quite normal, swelling has resolved and she's not having any pain. The atypical sciatic pain has also resolved. She is no longer having any dyspnea.  She had a lengthy discussion with the hospitalist physician with respect to Coumadin versus Xarelto anticoagulation and she elected to go on Xarelto. She is now completing the 15 mg twice daily 3 week induction dose and will continue on 20 mg single dose daily.  She has no prior history of blood clots. No problems with her two prior pregnancies. All 3 pregnancies by vaginal delivery. No signs or symptoms of a collagen vascular disorder. There is no family history of any clotting disorder in her parents or her only female sibling who is 61 years old. Her father did have a heart attack at a young age and a stroke at age 56.  Hypercoagulation profile was obtained in the hospital and everything is normal. Factor. 5 Leiden gene mutation not detected. Prothrombin gene mutation not checked. Total protein C level 77% of control (72-160), functional protein C 129% (75-133), total protein is 93% (60-150), functional protein S 124% (03-500), lupus anticoagulant, antibodies to Cardiolipin,  antibodies to beta 2 glycoprotein 1 not detected. Antithrombin level 95% (75-120), homocystine level X.2 (4-15.4). CBC on admission with a hemoglobin of 14.9, white count 5800, and platelet  count 183,000.    PMH: Past Medical History  Diagnosis Date  . Inappropriate sinus tachycardia   . Seasonal allergies   . Palpitations   . Fatigue   . Deep venous thrombosis 10/13     post partum  No history of hypertension, MI, stomach ulcers, diabetes, hepatitis, yellow jaundice, thyroid trouble, she passed a kidney stone in the remote past, no history of seizure or stroke.   Past Surgical History  Procedure Date  . Wisdom tooth extraction     Allergies: Allergies  Allergen Reactions  . Cheese Anaphylaxis  . Avelox (Moxifloxacin Hcl In Nacl) Hives  . Toprol Xl (Metoprolol Succinate) Hives  . Zithromax (Azithromycin) Hives    Medications: atenolol 25 mg twice a day; Zyrtec 10 mg daily; Zantac 75 mg twice a day when necessary; Xarelto 15 mg twice a day.to change to 20 mg daily   Social History: She lives in [place] and works in Audiological scientist estate. She has 2 boys aged 27 and 36 by a previous husband and now a new infant with her current husband who accompanies her today.  Reports that she has never smoked. She has never used smokeless tobacco. She reports that she does not drink alcohol or use illicit drugs.  Family History: Family History  Problem Relation Age of Onset  . Heart attack Father   . Diabetes Brother   . Heart attack Maternal Grandfather   . Diabetes Father   . Hypertension Father   . Hypertension Brother     Review of Systems: Constitutional symptoms: no constitutional symptoms HEENT: no sore throat Respiratory:  See above Cardiovascular:  See above Gastrointestinal ROS:  Occasional reflux symptoms taking a when necessary H2 blocker Genito-Urinary ROS:  No urinary tract symptoms Hematological and Lymphatic: during recent hospitalization, she was found to have a palpable right axillary lymph node which is under evaluation Musculoskeletal: see above Neurologic: no headache or change in vision Dermatologic: no rash Remaining ROS negative.  Physical Exam: Blood pressure 120/73, pulse 73, temperature 98.2 F (36.8 C), temperature source Oral, resp. rate 20, height 5' 6.5" (1.689 m), weight 158 lb 1.6 oz (71.714 kg), last menstrual period  10/18/2012. Wt Readings from Last 3 Encounters:  11/10/12 158 lb 1.6 oz (71.714 kg)  10/27/12 154 lb (69.854 kg)  10/18/12 160 lb 15 oz (73 kg)    General appearance: well-nourished, anxious, Caucasian woman Head:  Normal Neck:  Normal; thyroid symmetrically enlarged no nodules Lymph nodes: I am unable to palpate any lymphadenopathy in the right axilla, left axilla, neck, or supraclavicular region.;  Breasts: not examined Lungs: clear to auscultation resonant to percussion Heart: regular rhythm, no murmur, no gallop, no click Abdominal: soft, nontender, no mass, no organomegaly, no inguinal adenopathy GU: not examined Extremities: currently no edema, no calf tenderness Neurologic: mental status intact, PERRLA, discs sharp vessels normal, motor strength 5 over 5, reflexes 2+ symmetric Skin: no rash or ecchymosis    Lab Results: Lab Results  Component Value Date   WBC 5.3 10/19/2012   HGB 13.0 10/19/2012   HCT 38.6 10/19/2012   MCV 88.9 10/19/2012   PLT 119* 10/19/2012     Chemistry      Component Value Date/Time   NA 140 10/19/2012 0524   K 3.7 10/19/2012 0524   CL 110 10/19/2012 0524   CO2 23 10/19/2012 0524   BUN 12 10/19/2012 0524   CREATININE 0.83 10/19/2012 0524      Component Value Date/Time   CALCIUM 8.3* 10/19/2012  4098       Radiological Studies: see discussion above    Impression: Extensive proximal iliopelvic deep venous thrombosis occurring subacutely 3 weeks postpartum following a normal vaginal delivery. 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 No congenital or acquired coagulopathy detected so far. Negative prior personal and family history of clotting. Now status post thrombolytic therapy with venoplasty and placement of a venous stent  Recommendation: #1. Anticoagulation for 6 months. I reviewed with her and her husband in detail the risks  versus benefits of Coumadin versus Xarelto. Although Xarelto has many advantages, it is more expensive, it is not approved for women who are her breast-feeding, and we have no antidote in the event of serious bleeding. He does have a relatively short half-life of 8 hours. I told the patient that at this point in time, it is a individual decision whether or not to use  Xarelto or Coumadin if one understands the risks and benefits of both drugs. She elected to stay on the drug and asked that I prescribe it. To be complete, I am going to check for the presence of a prothrombin gene mutation. This would not change my current recommendation for duration of anticoagulation if positive.  #2. I would advise against any elective surgery for the next 6 months including tubal ligation.  #3. An IUD, barrier contraception, or a progestational agent only acceptable for contraception.  I will see her back again in 3 months. Check a d-dimer.      Levert Feinstein, MD 11/10/2012, 12:16 PM

## 2012-11-10 NOTE — Progress Notes (Signed)
Checked in new patient. She was late because she got lost. Checked with Dr. Eileen Stanford and they said wanted to still see her. Selena Batten said ok for lab also. No financial issues.

## 2012-11-10 NOTE — Telephone Encounter (Signed)
appts made and printed for pt  °

## 2012-11-11 ENCOUNTER — Other Ambulatory Visit: Payer: Self-pay | Admitting: Obstetrics and Gynecology

## 2012-11-11 DIAGNOSIS — N6310 Unspecified lump in the right breast, unspecified quadrant: Secondary | ICD-10-CM

## 2012-11-12 LAB — D-DIMER, QUANTITATIVE: D-Dimer, Quant: 0.62 ug/mL-FEU — ABNORMAL HIGH (ref 0.00–0.48)

## 2012-11-15 ENCOUNTER — Ambulatory Visit: Payer: BC Managed Care – PPO | Admitting: Internal Medicine

## 2012-11-17 ENCOUNTER — Ambulatory Visit
Admission: RE | Admit: 2012-11-17 | Discharge: 2012-11-17 | Disposition: A | Discharge status: Home / Self Care | Payer: BC Managed Care – PPO | Source: Ambulatory Visit | Attending: Obstetrics and Gynecology | Admitting: Obstetrics and Gynecology

## 2012-11-17 DIAGNOSIS — N6310 Unspecified lump in the right breast, unspecified quadrant: Secondary | ICD-10-CM

## 2012-11-18 ENCOUNTER — Telehealth: Payer: Self-pay | Admitting: *Deleted

## 2012-11-18 NOTE — Telephone Encounter (Signed)
Script given to pt per Dr Cyndie Chime 11/10/12 stating OI for massage.

## 2012-11-26 ENCOUNTER — Other Ambulatory Visit: Payer: Self-pay | Admitting: *Deleted

## 2012-12-13 ENCOUNTER — Encounter (HOSPITAL_COMMUNITY): Payer: Self-pay

## 2012-12-13 ENCOUNTER — Inpatient Hospital Stay (HOSPITAL_COMMUNITY)
Admission: AD | Admit: 2012-12-13 | Discharge: 2012-12-13 | Disposition: A | Discharge status: Home / Self Care | Payer: BC Managed Care – PPO | Source: Ambulatory Visit | Attending: Obstetrics and Gynecology | Admitting: Obstetrics and Gynecology

## 2012-12-13 DIAGNOSIS — IMO0002 Reserved for concepts with insufficient information to code with codable children: Secondary | ICD-10-CM | POA: Insufficient documentation

## 2012-12-13 LAB — CBC WITH DIFFERENTIAL/PLATELET
Eosinophils Relative: 1 % (ref 0–5)
HCT: 41.4 % (ref 36.0–46.0)
Lymphocytes Relative: 36 % (ref 12–46)
Lymphs Abs: 2.2 10*3/uL (ref 0.7–4.0)
MCV: 87.5 fL (ref 78.0–100.0)
Monocytes Absolute: 0.4 10*3/uL (ref 0.1–1.0)
Neutro Abs: 3.3 10*3/uL (ref 1.7–7.7)
Platelets: 157 10*3/uL (ref 150–400)
RBC: 4.73 MIL/uL (ref 3.87–5.11)
WBC: 6 10*3/uL (ref 4.0–10.5)

## 2012-12-13 LAB — PROTIME-INR: Prothrombin Time: 14.9 seconds (ref 11.6–15.2)

## 2012-12-13 NOTE — MAU Note (Signed)
Patient is in with c/o sudden onset of heavy vaginal bleeding with clots starting at 1300. Patient states that she had a leep/conization on 12/09/2012. She is 11weeks postpartum. Had left leg DVT 2 weeks postpartum.

## 2012-12-13 NOTE — MAU Provider Note (Signed)
CC: Vaginal Bleeding    First Provider Initiated Contact with Patient 12/13/12 1854      HPI [name] is a 41 y.o. [id number] who is 3 days post LEEP and  presents with onset today of BRB. She describes having 2 episodes earlier today of a gush of bright red blood when standing up. Later, at 1600 she stood up and passed of golf ball size clot which concerned her more. Prior to that she had been having scant watery discharge and dark blood.  She has had light intermittent bleeding since SVD on 09/29/12 and PP Depo provera. She had extensive left pelvic DVT 10/17/12, has a stent and has been on anticoagulation, now Xarelto 20 mg/day. She called Dr. Rana Snare after passing the clot and he advised her to come in. Spotting only since then.      Past Medical History  Diagnosis Date  . Inappropriate sinus tachycardia   . Seasonal allergies   . Palpitations   . Fatigue   . Deep venous thrombosis 10/13    post partum    OB History    Grav Para Term Preterm Abortions TAB SAB Ect Mult Living   3 3 2 1      3      # Outc Date GA Lbr Len/2nd Wgt Sex Del Anes PTL Lv   1 PRE 10/13 [redacted]w[redacted]d 00:00 / 00:11 6lb2.6oz(2.795kg) M SVD EPI  Yes   2 TRM            3 TRM               Past Surgical History  Procedure Date  . Wisdom tooth extraction   . Coronary angioplasty     History   Social History  . Marital Status: Single    Spouse Name: N/A    Number of Children: N/A  . Years of Education: N/A   Occupational History  . Not on file.   Social History Main Topics  . Smoking status: Never Smoker   . Smokeless tobacco: Never Used  . Alcohol Use: No  . Drug Use: No  . Sexually Active: Not Currently    Birth Control/ Protection: Pill, Injection   Other Topics Concern  . 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    No current facility-administered medications on file prior to encounter.   Current Outpatient Prescriptions on File Prior to Encounter   Medication Sig Dispense Refill  . atenolol (TENORMIN) 25 MG tablet Take 1 tablet (25 mg total) by mouth 2 (two) times daily.  180 tablet  3  . cetirizine (ZYRTEC) 10 MG tablet Take 10 mg by mouth daily.        Allergies  Allergen Reactions  . Cheese Anaphylaxis  . Avelox (Moxifloxacin Hcl In Nacl) Hives  . Toprol Xl (Metoprolol Succinate) Hives  . Zithromax (Azithromycin) Hives    ROS Pertinent items in HPI  PHYSICAL EXAM Filed Vitals:   12/13/12 1840  BP: 116/66  Pulse: 74  Temp: 98.3 F (36.8 C)  Resp: 18   General: Well nourished, well developed female in no acute distress Cardiovascular: Normal rate Respiratory: Normal effort Abdomen: Soft, nontender Back: No CVAT Extremities: No edema Neurologic: Alert and oriented Speculum exam: NEFG; vagina with scant dark blood in vault; no active bleeding when cx swabbed. Cx healing, no discharge noted   LAB RESULTS Results for orders placed during the hospital encounter of 12/13/12 (from  the past 24 hour(s))  CBC WITH DIFFERENTIAL     Status: Normal   Collection Time   12/13/12  6:51 PM      Component Value Range   WBC 6.0  4.0 - 10.5 K/uL   RBC 4.73  3.87 - 5.11 MIL/uL   Hemoglobin 14.3  12.0 - 15.0 g/dL   HCT 40.9  81.1 - 91.4 %   MCV 87.5  78.0 - 100.0 fL   MCH 30.2  26.0 - 34.0 pg   MCHC 34.5  30.0 - 36.0 g/dL   RDW 78.2  95.6 - 21.3 %   Platelets 157  150 - 400 K/uL   Neutrophils Relative 55  43 - 77 %   Neutro Abs 3.3  1.7 - 7.7 K/uL   Lymphocytes Relative 36  12 - 46 %   Lymphs Abs 2.2  0.7 - 4.0 K/uL   Monocytes Relative 7  3 - 12 %   Monocytes Absolute 0.4  0.1 - 1.0 K/uL   Eosinophils Relative 1  0 - 5 %   Eosinophils Absolute 0.1  0.0 - 0.7 K/uL   Basophils Relative 0  0 - 1 %   Basophils Absolute 0.0  0.0 - 0.1 K/uL  PROTIME-INR     Status: Normal   Collection Time   12/13/12  6:51 PM      Component Value Range   Prothrombin Time 14.9  11.6 - 15.2 seconds   INR 1.19  0.00 - 1.49      PLAN Discharge home. See AVS for patient education.  Follow-up Information    Schedule an appointment as soon as possible for a visit with LOWE,DAVID C, MD. (As needed)    Contact information:   [address] [place], [address] [place] [address] [phone]           Medication List     As of 12/13/2012  9:02 PM    TAKE these medications         acetaminophen 500 MG tablet   Commonly known as: TYLENOL   Take 1,000 mg by mouth every 6 (six) hours as needed. pain      atenolol 25 MG tablet   Commonly known as: TENORMIN   Take 1 tablet (25 mg total) by mouth 2 (two) times daily.      cetirizine 10 MG tablet   Commonly known as: ZYRTEC   Take 10 mg by mouth daily.      XARELTO 10 MG Tabs tablet   Generic drug: rivaroxaban   Take 10 mg by mouth every evening.           Danae Orleans, CNM 12/13/2012 7:55 PM

## 2012-12-14 ENCOUNTER — Telehealth: Payer: Self-pay | Admitting: Oncology

## 2012-12-14 NOTE — Telephone Encounter (Signed)
Pt called today wanting an appt with Dr. Cyndie Chime per her MD, explained to her that first appt for MD is February and I need order for appt. Call transferred to RN

## 2012-12-15 ENCOUNTER — Encounter: Payer: Self-pay | Admitting: Oncology

## 2012-12-15 NOTE — Progress Notes (Signed)
Phone conversation with this 41 year old woman I recently saw for consultation in the office on November 13. She sustained an extensive left pelvic DVT during the 20th week of her pregnancy. She required thrombolytic therapy on October 21. She was anticoagulated with Xarelto. Hypercoagulation evaluation failed to reveal any inherited or acquired defects. 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 I was called by her gynecologist Dr. Rana Snare yesterday. She has developed dysfunctional bleeding. Xarelto was stopped. Of note it will take 1-2 days for this anticoagulant to get out of her system. There are no currently available reversal agents. We don't have enough data about the use of anti-fibrinolytic agents although some experts are using them on a case by case basis. The more expensive parenteral clotting factor concentrates have not been tested clinically and you should be limited to life-threatening hemorrhage. I felt that a trial of oral anti-fibrinolytic or topical anti-fibrinolytic would be reasonable. Once bleeding stops, I recommend that the patient go on Coumadin without a loading dose and without a Lovenox bridge. If we run into problems with Coumadin, we can reverse its effect so from that point of view it is safer than going back on the Xarelto. Patient called me today and we discussed the above. The bleeding is slowing down. She will see Dr. Rana Snare again tomorrow and I gave her my cell phone so she can call me  with an update so we can make plans with respect to Coumadin therapy which can be monitored in my office.

## 2012-12-16 ENCOUNTER — Other Ambulatory Visit: Payer: Self-pay | Admitting: *Deleted

## 2012-12-16 DIAGNOSIS — I82409 Acute embolism and thrombosis of unspecified deep veins of unspecified lower extremity: Secondary | ICD-10-CM

## 2012-12-16 MED ORDER — WARFARIN SODIUM 5 MG PO TABS: 5.0000 mg | ORAL_TABLET | Freq: Every day | ORAL | Status: DC

## 2012-12-17 ENCOUNTER — Other Ambulatory Visit: Payer: Self-pay | Admitting: Oncology

## 2012-12-17 DIAGNOSIS — O871 Deep phlebothrombosis in the puerperium: Secondary | ICD-10-CM

## 2012-12-20 ENCOUNTER — Other Ambulatory Visit (HOSPITAL_BASED_OUTPATIENT_CLINIC_OR_DEPARTMENT_OTHER): Payer: BC Managed Care – PPO | Admitting: Lab

## 2012-12-20 ENCOUNTER — Other Ambulatory Visit: Payer: Self-pay | Admitting: Pharmacist

## 2012-12-20 ENCOUNTER — Ambulatory Visit (HOSPITAL_BASED_OUTPATIENT_CLINIC_OR_DEPARTMENT_OTHER): Payer: BC Managed Care – PPO | Admitting: Pharmacist

## 2012-12-20 DIAGNOSIS — O871 Deep phlebothrombosis in the puerperium: Secondary | ICD-10-CM

## 2012-12-20 DIAGNOSIS — O2230 Deep phlebothrombosis in pregnancy, unspecified trimester: Secondary | ICD-10-CM

## 2012-12-20 DIAGNOSIS — Z7901 Long term (current) use of anticoagulants: Secondary | ICD-10-CM

## 2012-12-20 DIAGNOSIS — Z86718 Personal history of other venous thrombosis and embolism: Secondary | ICD-10-CM

## 2012-12-20 LAB — PROTIME-INR: Protime: 14.4 Seconds — ABNORMAL HIGH (ref 10.6–13.4)

## 2012-12-20 LAB — POCT INR: INR: 1.2

## 2012-12-20 NOTE — Progress Notes (Signed)
Pt's INR is subtherapeutic at 1.2 today. She states that she has been eating a salad every day since she has started on Coumadin on 12/17.  I have increased her dose to Coumadin 5mg  daily except 7.5mg  on MWF. She denies any bleeding since she has been on the Coumadin, although she was bleeding heavily when she was on the Xarelto earlier last week. She will follow up on on 12/31/12 for an INR check and CC visit

## 2012-12-20 NOTE — Patient Instructions (Signed)
Take 5mg  daily except 7.5mg  on MWF. Return on Friday, 12/31/12 at 0930am for lab and 945 for Coumadin Clinic

## 2012-12-21 ENCOUNTER — Telehealth: Payer: Self-pay | Admitting: Pharmacist

## 2012-12-21 ENCOUNTER — Telehealth: Payer: Self-pay | Admitting: Oncology

## 2012-12-21 ENCOUNTER — Ambulatory Visit (HOSPITAL_BASED_OUTPATIENT_CLINIC_OR_DEPARTMENT_OTHER): Payer: BC Managed Care – PPO

## 2012-12-21 VITALS — BP 111/71 | HR 70 | Temp 96.7°F

## 2012-12-21 DIAGNOSIS — I82409 Acute embolism and thrombosis of unspecified deep veins of unspecified lower extremity: Secondary | ICD-10-CM

## 2012-12-21 MED ORDER — ENOXAPARIN SODIUM 100 MG/ML ~~LOC~~ SOLN
100.0000 mg | Freq: Once | SUBCUTANEOUS | Status: AC
  Administered 2012-12-21: 100 mg via SUBCUTANEOUS
  Filled 2012-12-21: qty 1

## 2012-12-21 NOTE — Telephone Encounter (Signed)
Gave pt appt for lab and coumadin, pt will see MD on February 2014 with labs

## 2012-12-21 NOTE — Telephone Encounter (Signed)
Received communication from [employer] on behalf of Dr. Cyndie Chime. Given patient's INR from yesterday, Dr. Cyndie Chime would like her to increase her dose of Coumadin to 7.5mg  daily, and start Lovenox 1.5mg /kg SQ daily until INR >2. Called and informed patient of the changes.  She will come in today to the office for Lovenox teaching and her first Lovenox injection with the injection nurse.    Called new Rx for Lovenox to her [facility]: Lovenox 100mg  SQ daily #10, 1 RF, ZO:[id number]  Pt communicated understanding of the above changes.  She will come in today at 1345 for her injection appt.

## 2012-12-21 NOTE — Progress Notes (Signed)
[name] here to start on Lovenox injections.  Discussed with patient and husband about lovenox and how it bridges her until Coumadin gets to therapeutic level.  She is already aware of diet restrictions with Coumadin.  She gave herself the injection with slight discomfort.  Husband will help her with the injections.  All questions answered.  She is scheduled to come back on Friday for lab and coumadin clinic appointments.

## 2012-12-23 ENCOUNTER — Telehealth: Payer: Self-pay | Admitting: *Deleted

## 2012-12-23 NOTE — Telephone Encounter (Signed)
Called patient and got this recent hx.  Patient took Lovenox and coumadin on Tuesday.  She had bleeding after this.  She texted Dr. Cyndie Chime, who told her to stop her Lovenox but continue the coumadin at 7.5mg  daily.  The bleeding stopped 12/25.  She is not bleeding presently and is continuing on the 7.5mg  coumadin daily with NO Lovenox.  She will come in the morning for lab and the coumadin clinic and will assess then to see about any changes..  She verbalizes understanding of this plan.

## 2012-12-24 ENCOUNTER — Other Ambulatory Visit (HOSPITAL_BASED_OUTPATIENT_CLINIC_OR_DEPARTMENT_OTHER): Payer: BC Managed Care – PPO | Admitting: Lab

## 2012-12-24 ENCOUNTER — Ambulatory Visit (HOSPITAL_BASED_OUTPATIENT_CLINIC_OR_DEPARTMENT_OTHER): Payer: BC Managed Care – PPO | Admitting: Pharmacist

## 2012-12-24 DIAGNOSIS — O871 Deep phlebothrombosis in the puerperium: Secondary | ICD-10-CM

## 2012-12-24 DIAGNOSIS — O2230 Deep phlebothrombosis in pregnancy, unspecified trimester: Secondary | ICD-10-CM

## 2012-12-24 LAB — PROTIME-INR

## 2012-12-24 NOTE — Progress Notes (Signed)
INR subtherapeutic today (1.6) on 7.5mg  daily.  Pt received one dose of Lovenox in combination with this dose, then started bleeding profusely "pouring" from uterine cervix.  She texted Dr. Cyndie Chime about her problems, and he instructed her to stop the Lovenox, but continue the Coumadin 7.5mg  daily.  Pt has not missed any doses.  No changes in meds or diet.  Pt has not bled since 12/22/12.  No complaints today.    Will have patient increase dose to 10mg  daily, and recheck INR on 12/30/12 after patient returns from trip out of town.

## 2012-12-30 ENCOUNTER — Ambulatory Visit (HOSPITAL_BASED_OUTPATIENT_CLINIC_OR_DEPARTMENT_OTHER): Payer: BC Managed Care – PPO | Admitting: Pharmacist

## 2012-12-30 ENCOUNTER — Other Ambulatory Visit (HOSPITAL_BASED_OUTPATIENT_CLINIC_OR_DEPARTMENT_OTHER): Payer: BC Managed Care – PPO | Admitting: Lab

## 2012-12-30 DIAGNOSIS — I82409 Acute embolism and thrombosis of unspecified deep veins of unspecified lower extremity: Secondary | ICD-10-CM

## 2012-12-30 DIAGNOSIS — Z7901 Long term (current) use of anticoagulants: Secondary | ICD-10-CM

## 2012-12-30 DIAGNOSIS — Z5181 Encounter for therapeutic drug level monitoring: Secondary | ICD-10-CM

## 2012-12-30 DIAGNOSIS — Z86718 Personal history of other venous thrombosis and embolism: Secondary | ICD-10-CM

## 2012-12-30 DIAGNOSIS — O871 Deep phlebothrombosis in the puerperium: Secondary | ICD-10-CM

## 2012-12-30 LAB — PROTIME-INR
INR: 4.8 — ABNORMAL HIGH (ref 2.00–3.50)
Protime: 57.6 Seconds — ABNORMAL HIGH (ref 10.6–13.4)

## 2012-12-30 LAB — POCT INR: INR: 4.8

## 2012-12-30 MED ORDER — WARFARIN SODIUM 5 MG PO TABS: 7.5000 mg | ORAL_TABLET | Freq: Every day | ORAL | Status: DC

## 2012-12-30 NOTE — Progress Notes (Signed)
INR = 4.8 on 10 mg/day [name] has had diarrhea since Monday 12/30.  Doing better today.  PO intake has been significantly decreased.  She started on probiotic & ate yogurt today. INR supratherapeutic.  Will hold 2 doses of Coumadin then decrease dose to 7.5 mg/day starting Saturday. Repeat INR on Monday. Pt can't come Tuesday because she has a class to attend. [name], Pharm.D.

## 2012-12-31 ENCOUNTER — Encounter: Payer: Self-pay | Admitting: Lab

## 2012-12-31 ENCOUNTER — Other Ambulatory Visit: Payer: Self-pay | Admitting: Pharmacist

## 2012-12-31 ENCOUNTER — Other Ambulatory Visit: Payer: Self-pay | Admitting: Oncology

## 2012-12-31 ENCOUNTER — Ambulatory Visit (HOSPITAL_BASED_OUTPATIENT_CLINIC_OR_DEPARTMENT_OTHER): Payer: BC Managed Care – PPO | Admitting: Lab

## 2012-12-31 DIAGNOSIS — N939 Abnormal uterine and vaginal bleeding, unspecified: Secondary | ICD-10-CM

## 2012-12-31 DIAGNOSIS — N898 Other specified noninflammatory disorders of vagina: Secondary | ICD-10-CM

## 2012-12-31 DIAGNOSIS — O871 Deep phlebothrombosis in the puerperium: Secondary | ICD-10-CM

## 2012-12-31 DIAGNOSIS — Z7901 Long term (current) use of anticoagulants: Secondary | ICD-10-CM

## 2012-12-31 DIAGNOSIS — R197 Diarrhea, unspecified: Secondary | ICD-10-CM

## 2012-12-31 DIAGNOSIS — R200 Anesthesia of skin: Secondary | ICD-10-CM

## 2012-12-31 LAB — CBC WITH DIFFERENTIAL/PLATELET
BASO%: 0.3 % (ref 0.0–2.0)
EOS%: 1 % (ref 0.0–7.0)
MCH: 31 pg (ref 25.1–34.0)
MCHC: 35.1 g/dL (ref 31.5–36.0)
MONO#: 0.4 10*3/uL (ref 0.1–0.9)
RBC: 4.38 10*6/uL (ref 3.70–5.45)
RDW: 13.6 % (ref 11.2–14.5)
WBC: 6.3 10*3/uL (ref 3.9–10.3)
lymph#: 1.5 10*3/uL (ref 0.9–3.3)

## 2012-12-31 LAB — PROTIME-INR: Protime: 28.8 Seconds — ABNORMAL HIGH (ref 10.6–13.4)

## 2012-12-31 LAB — D-DIMER, QUANTITATIVE: D-Dimer, Quant: 0.26 ug/mL-FEU (ref 0.00–0.48)

## 2012-12-31 NOTE — Progress Notes (Signed)
Work in visit for this 42 year old woman I saw back in mid November for a routine outpatient consultation. She sustained an extensive left pelvic DVT a few weeks postpartum in October 2013. She required thrombolytic therapy, mechanical clot retrieval, and a venous stent. She was started on anticoagulation with Xarelto. I felt at 6 months of anticoagulation would be indicated. Screening for congenital or acquired etiologies for clotting tendencies were unrevealing and it is presumed  given the anatomic findings at time of venogram and thrombolytic therapy,  that the other clot was due to the May Thurner syndrome.  I was called by her gynecologist in mid December. She developed significant bleeding after a LEEP procedure done for abnormal cervical cytology. We stopped her Xarelto. She received some local treatments and oral anti-fibrinolytic  therapy for a few days and the bleeding stopped. I elected to keep her off the Xarelto and start her on Coumadin for ease of reversal. She was only on the Coumadin for a few days when she started to have vaginal bleeding again despite a subtherapeutic INR. I did not proceed with a Lovenox bridge. I did continue the Coumadin.  Over the Saucier Year's weekend she developed gastroenteritis characterized primarily by nausea and diarrhea without any vomiting or fever. This occurred while we were trying to get her Coumadin adjusted. She was just increased to 10 mg of Coumadin daily right before she developed the gastroenteritis. When we checked a pro time in our office yesterday January 2,  INR was supratherapeutic at 4.8. At that point she had not had any recurrent vaginal bleeding. We told her to hold the Coumadin for 48 hours and then resume at 7.5 mg.  She sent me a text message last evening around 9:30 PM that she again was having profuse vaginal bleeding. I called in a prescription for vitamin K. She took 2.5 mg at 10:40 PM and per my instructions since bleeding did not subside  in the next few hours, took another 2.5 mg at 1:40 AM on January 3. She called our office this morning to report that the bleeding had not stopped. We got her in and checked an INR which is down to the therapeutic range at 2.4. She has a normal platelet count of 176,000. Her hemoglobin is 13.6 g compared with 14.3 g recorded in our office on 12/13/2012. She states that she went through 3 Kotex pads over a one hour interval.  Impression: I suspect that she still has some cervical pathology to account for the bleeding that is unrelated to the anticoagulants despite the supratherapeutic value obtained yesterday.  Disposition: I discussed the situation with Dr. Enid Cutter Comb covering for Dr. Rana Snare  and he is happy to evaluate the patient this morning. I will continue to hold the Coumadin. By tomorrow she will be subtherapeutic. I will wait until she has her GYN evaluation to decide on the appropriate time to resume her Coumadin.

## 2013-01-03 ENCOUNTER — Other Ambulatory Visit (HOSPITAL_BASED_OUTPATIENT_CLINIC_OR_DEPARTMENT_OTHER): Payer: BC Managed Care – PPO | Admitting: Lab

## 2013-01-03 ENCOUNTER — Ambulatory Visit: Payer: BC Managed Care – PPO | Admitting: Internal Medicine

## 2013-01-03 ENCOUNTER — Ambulatory Visit (HOSPITAL_BASED_OUTPATIENT_CLINIC_OR_DEPARTMENT_OTHER): Payer: BC Managed Care – PPO | Admitting: Pharmacist

## 2013-01-03 DIAGNOSIS — Z7901 Long term (current) use of anticoagulants: Secondary | ICD-10-CM

## 2013-01-03 DIAGNOSIS — O871 Deep phlebothrombosis in the puerperium: Secondary | ICD-10-CM

## 2013-01-03 DIAGNOSIS — O2230 Deep phlebothrombosis in pregnancy, unspecified trimester: Secondary | ICD-10-CM

## 2013-01-03 LAB — PROTIME-INR: INR: 1.2 — ABNORMAL LOW (ref 2.00–3.50)

## 2013-01-03 NOTE — Progress Notes (Signed)
INR = 1.2 today after holding Coumadin for vaginal bleeding Wt = 161.2 lbs = 73 kg Pt got vit K last week for supratherapeutic INR & bleeding. She held Coumadin until last night at Dr. Patsy Lager instruction.  She started back on 7.5 mg/day last night. She was seen by her OB/GYN office on Friday & was told the bleeding could have potentially been caused by the Depo-provera thinning her cervix.  She was given Estrogen (oral contraception): 2 tabs Fri, Sat, Sunday then starting today, take 1 tab Mon, Tues, Wed then stop.  The vaginal bleeding stopped within 4 hours of her first Estrogen dose Friday & she has subsequently had no further bleeding. She only c/o diarrhea which she wonders if it is from warfarin.  She has up to 6 watery stools/day. This is not an uncommon side effect of warfarin but she is willing to go through the diarrhea in order to treat her clotting disorder (risk vs benefit). INR subtherapeutic.  I have discussed w/ Dr. Cyndie Chime & he wants pt to go on reduced dose of Lovenox (total of 1 mg/kg/day).  Given pts reluctance to start back on Lovenox since she bled on Lovenox previously, I will split her dose into 2 daily doses at 0.5 mg/kg q12h.  This way, if she begins bleeding again, she has less Lovenox on board than if she took a full 1 mg/kg daily dose.  Pt agrees to this plan.   Pt was given her first 40 mg Lovenox injection in the Coumadin clinic.  Sabino Snipes, RN walked pt & her husband through the injection.  Since the first injection was given at 3:15 pm today, she will wait until 8 am tomorrow to take her next Lovenox. Continue Warfarin 7.5 mg/day Repeat INR on Friday 01/07/13. If she has any trouble before Friday, she will be in touch w/ Dr. Cyndie Chime by text. [name], Pharm.D.

## 2013-01-05 ENCOUNTER — Other Ambulatory Visit: Payer: Self-pay | Admitting: Oncology

## 2013-01-05 ENCOUNTER — Ambulatory Visit (HOSPITAL_BASED_OUTPATIENT_CLINIC_OR_DEPARTMENT_OTHER): Payer: BC Managed Care – PPO

## 2013-01-05 ENCOUNTER — Ambulatory Visit (HOSPITAL_BASED_OUTPATIENT_CLINIC_OR_DEPARTMENT_OTHER): Payer: BC Managed Care – PPO | Admitting: Oncology

## 2013-01-05 ENCOUNTER — Ambulatory Visit (HOSPITAL_COMMUNITY)
Admission: RE | Admit: 2013-01-05 | Discharge: 2013-01-05 | Disposition: A | Discharge status: Home / Self Care | Payer: BC Managed Care – PPO | Source: Ambulatory Visit | Attending: Oncology | Admitting: Oncology

## 2013-01-05 VITALS — BP 114/72 | HR 83 | Temp 98.7°F | Resp 18 | Ht 68.0 in | Wt 162.4 lb

## 2013-01-05 DIAGNOSIS — Z7901 Long term (current) use of anticoagulants: Secondary | ICD-10-CM

## 2013-01-05 DIAGNOSIS — I82409 Acute embolism and thrombosis of unspecified deep veins of unspecified lower extremity: Secondary | ICD-10-CM

## 2013-01-05 DIAGNOSIS — Z86718 Personal history of other venous thrombosis and embolism: Secondary | ICD-10-CM

## 2013-01-05 DIAGNOSIS — O871 Deep phlebothrombosis in the puerperium: Secondary | ICD-10-CM

## 2013-01-05 DIAGNOSIS — M7989 Other specified soft tissue disorders: Secondary | ICD-10-CM | POA: Insufficient documentation

## 2013-01-05 LAB — CBC WITH DIFFERENTIAL/PLATELET
Basophils Absolute: 0 10*3/uL (ref 0.0–0.1)
Eosinophils Absolute: 0 10*3/uL (ref 0.0–0.5)
HGB: 11.5 g/dL — ABNORMAL LOW (ref 11.6–15.9)
LYMPH%: 30 % (ref 14.0–49.7)
MCV: 90.1 fL (ref 79.5–101.0)
MONO#: 0.3 10*3/uL (ref 0.1–0.9)
MONO%: 6.6 % (ref 0.0–14.0)
NEUT#: 2.9 10*3/uL (ref 1.5–6.5)
Platelets: 168 10*3/uL (ref 145–400)
RBC: 3.64 10*6/uL — ABNORMAL LOW (ref 3.70–5.45)
RDW: 13.9 % (ref 11.2–14.5)
WBC: 4.7 10*3/uL (ref 3.9–10.3)

## 2013-01-05 LAB — PROTIME-INR
INR: 1.5 — ABNORMAL LOW (ref 2.00–3.50)
Protime: 18 Seconds — ABNORMAL HIGH (ref 10.6–13.4)

## 2013-01-05 NOTE — Progress Notes (Signed)
*  PRELIMINARY RESULTS* Vascular Ultrasound Left lower extremity venous duplex has been completed.  Preliminary findings: Left:  No evidence of DVT, superficial thrombosis, or Baker's cyst.   Farrel Demark, RDMS, RVT 01/05/2013, 5:10 PM

## 2013-01-06 LAB — D-DIMER, QUANTITATIVE: D-Dimer, Quant: 0.27 ug/mL-FEU (ref 0.00–0.48)

## 2013-01-06 NOTE — Progress Notes (Signed)
Hematology and Oncology Follow Up Visit  [name] [id number] [birth year] 42 y.o. 01/06/2013 1:17 PM  Date of service: 01/05/13    Principle Diagnosis: Encounter Diagnosis  Name Primary?  . Postpartum deep vein thrombosis Yes     Interim History:   Followup visit for this 43 year old woman who likely had a subclinical pelvic and femoral vein thrombosis in her third trimester of pregnancy which was diagnosed when she became more symptomatic postpartum. She required thrombolytic therapy and a venous stent on 10/18/2012.Marland Kitchen She was started on initial anticoagulation with Xarelto. She developed vaginal bleeding associated initially with a LEEP procedure and I  discussed management with her gynecologist on December 17. We stopped her Xarelto. She was given oral  Antifibrinolytic agents and I believe, a progesterone injection, and bleeding subsided. She was started on Coumadin for ease of reversibility initially at 5 mg daily. She was subtherapeutic after 4 days and dose increased to 7.5. The vaginal bleeding stopped. Unfortunately she developed a viral gastroenteritis with profuse diarrhea. This occurred at the time that we increased her Coumadin to 10 mg. INR became supratherapeutic at 4.8. She developed recurrent vaginal bleeding.Marland Kitchen She was evaluated by Dr.McComb. The bleeding was coming from the uterus and not the cervix. He felt that this was likely related to hormonal  breakthrough from the recent progesterone injection. She was put on oral estrogen in tapering doses. We had to hold her anticoagulation again due to the supratherapeutic INR and recurrent vaginal bleeding. Vaginal bleeding stopped promptly within hours of starting the estrogen. I had her resume the Coumadin at 7.5 mg and cautiously put her on a Lovenox bridge at 0.5 mg per kilogram subcutaneous twice daily on Monday. She called again on Tuesday evening. She now reported that her left calf felt swollen and tight. I had her increase her  Lovenox up to a therapeutic dose and told her to come in for evaluation today. She reports no chest pain, dyspnea, or palpitations.     Medications: reviewed  Allergies:  Allergies  Allergen Reactions  . Cheese Anaphylaxis  . Avelox (Moxifloxacin Hcl In Nacl) Hives  . Toprol Xl (Metoprolol Succinate) Hives  . Zithromax (Azithromycin) Hives    Physical Exam: Blood pressure 114/72, pulse 83, temperature 98.7 F (37.1 C), temperature source Oral, resp. rate 18, height 5\' 8"  (1.727 m), weight 162 lb 6.4 oz (73.664 kg), not currently breastfeeding. Wt Readings from Last 3 Encounters:  01/05/13 162 lb 6.4 oz (73.664 kg)  11/10/12 158 lb 1.6 oz (71.714 kg)  10/27/12 154 lb (69.854 kg)     General appearance: Anxious Caucasian woman HENNT:  Lymph nodes:  Breasts: Lungs: Clear to auscultation resonant to percussion Heart: Regular rhythm no murmur Abdomen: Soft, nontender Extremities: No edema, no calf tenderness, left calf measures 37 cm right 35, left ankle 21 cm right 21. Vascular: No cyanosis, or cells pedis pulses 2+ symmetric Neurologic: No focal deficit Skin: No rash or ecchymosis  Lab Results: Lab Results  Component Value Date   WBC 4.7 01/05/2013   HGB 11.5* 01/05/2013   HCT 32.8* 01/05/2013   MCV 90.1 01/05/2013   PLT 168 01/05/2013     Chemistry      Component Value Date/Time   NA 140 10/19/2012 0524   K 3.7 10/19/2012 0524   CL 110 10/19/2012 0524   CO2 23 10/19/2012 0524   BUN 12 10/19/2012 0524   CREATININE 0.83 10/19/2012 0524      Component Value Date/Time   CALCIUM 8.3*  10/19/2012 0524       Radiological Studies: Venous Doppler to be done   Impression and Plan: Although she is at risk for recurrent thrombosis given the erratic use of anticoagulants over the last few weeks, I think there is a low clinical likelihood that she has had a new clot. Plan: I am going to continue her Lovenox at therapeutic dose of 1.5 mg per kilogram every 24 hours and  Coumadin 7.5 mg by mouth daily. In going to get a venous Doppler study and check a D.-dimer blood level.  Addendum: D.-dimer is normal at 0.27 Venous Dopplers study shows patent femoral and popliteal veins. No evidence for rethrombosis.   CC:. Dr. Candice Camp   Susan Feinstein, MD 1/9/20141:17 PM

## 2013-01-07 ENCOUNTER — Other Ambulatory Visit (HOSPITAL_BASED_OUTPATIENT_CLINIC_OR_DEPARTMENT_OTHER): Payer: BC Managed Care – PPO | Admitting: Lab

## 2013-01-07 ENCOUNTER — Ambulatory Visit (HOSPITAL_BASED_OUTPATIENT_CLINIC_OR_DEPARTMENT_OTHER): Payer: BC Managed Care – PPO | Admitting: Pharmacist

## 2013-01-07 DIAGNOSIS — O2230 Deep phlebothrombosis in pregnancy, unspecified trimester: Secondary | ICD-10-CM

## 2013-01-07 DIAGNOSIS — O871 Deep phlebothrombosis in the puerperium: Secondary | ICD-10-CM

## 2013-01-07 DIAGNOSIS — Z7901 Long term (current) use of anticoagulants: Secondary | ICD-10-CM

## 2013-01-07 LAB — PROTIME-INR
INR: 1.8 — ABNORMAL LOW (ref 2.00–3.50)
Protime: 21.6 Seconds — ABNORMAL HIGH (ref 10.6–13.4)

## 2013-01-07 NOTE — Patient Instructions (Signed)
Continue 7.5 mg/day and lovenox 100mg daily. Recheck INR on Monday, 01/10/13 per Dr. G.   Lab will be at 11:15 am, and Coumadin Clinic at 11:30 am. 

## 2013-01-07 NOTE — Progress Notes (Signed)
Continue 7.5 mg/day and lovenox 100mg  daily. Recheck INR on Monday, 01/10/13 per Dr. Reece Agar.   Lab will be at 11:15 am, and Coumadin Clinic at 11:30 am.

## 2013-01-10 ENCOUNTER — Other Ambulatory Visit: Payer: BC Managed Care – PPO | Admitting: Lab

## 2013-01-10 ENCOUNTER — Ambulatory Visit (HOSPITAL_BASED_OUTPATIENT_CLINIC_OR_DEPARTMENT_OTHER): Payer: BC Managed Care – PPO | Admitting: Pharmacist

## 2013-01-10 DIAGNOSIS — O2230 Deep phlebothrombosis in pregnancy, unspecified trimester: Secondary | ICD-10-CM

## 2013-01-10 DIAGNOSIS — O871 Deep phlebothrombosis in the puerperium: Secondary | ICD-10-CM

## 2013-01-10 DIAGNOSIS — Z7901 Long term (current) use of anticoagulants: Secondary | ICD-10-CM

## 2013-01-10 LAB — POCT INR: INR: 1.9

## 2013-01-10 NOTE — Progress Notes (Signed)
Continue 7.5 mg/day and lovenox 100mg  daily. Recheck INR on 01/13/13; lab at 11 am and Coumadin Clinic at 11:15am. Plan discussed with Dr. Cyndie Chime.

## 2013-01-10 NOTE — Patient Instructions (Signed)
Continue 7.5 mg/day and lovenox 100mg  daily. Recheck INR on 01/13/13; lab will be at 11 am and Coumadin Clinic at 11:15am.

## 2013-01-11 ENCOUNTER — Telehealth: Payer: Self-pay | Admitting: *Deleted

## 2013-01-11 NOTE — Telephone Encounter (Signed)
Message copied by Orbie Hurst on Tue Jan 11, 2013 11:01 AM ------      Message from: Levert Feinstein      Created: Thu Jan 06, 2013  8:47 AM       Call pt d-dimer test normal

## 2013-01-11 NOTE — Telephone Encounter (Signed)
Spoke with patient and let her know that d-dimer test was normal.

## 2013-01-13 ENCOUNTER — Ambulatory Visit (HOSPITAL_BASED_OUTPATIENT_CLINIC_OR_DEPARTMENT_OTHER): Payer: BC Managed Care – PPO | Admitting: Pharmacist

## 2013-01-13 ENCOUNTER — Other Ambulatory Visit (HOSPITAL_BASED_OUTPATIENT_CLINIC_OR_DEPARTMENT_OTHER): Payer: BC Managed Care – PPO | Admitting: Lab

## 2013-01-13 DIAGNOSIS — Z86718 Personal history of other venous thrombosis and embolism: Secondary | ICD-10-CM

## 2013-01-13 DIAGNOSIS — O871 Deep phlebothrombosis in the puerperium: Secondary | ICD-10-CM

## 2013-01-13 DIAGNOSIS — O2230 Deep phlebothrombosis in pregnancy, unspecified trimester: Secondary | ICD-10-CM

## 2013-01-13 DIAGNOSIS — Z7901 Long term (current) use of anticoagulants: Secondary | ICD-10-CM

## 2013-01-13 LAB — PROTIME-INR: Protime: 27.6 Seconds — ABNORMAL HIGH (ref 10.6–13.4)

## 2013-01-13 LAB — POCT INR: INR: 2.3

## 2013-01-13 NOTE — Progress Notes (Signed)
INR = 2.3 on 7.5 mg/day & Lovenox 100 mg daily. Pt states she had L leg weakness; not major.  Just had to stop exercising the other day.  She thinks her calf now feels a little smaller. No evidence for new clot. No further bleeding. INR at goal.  Cont Coumadin 7.5 mg/day.  D/C Lovenox. Repeat INR on 01/19/13 at pt request since she already will be in [place] that day for another appt [name], Pharm.D.

## 2013-01-19 ENCOUNTER — Other Ambulatory Visit (HOSPITAL_BASED_OUTPATIENT_CLINIC_OR_DEPARTMENT_OTHER): Payer: BC Managed Care – PPO | Admitting: Lab

## 2013-01-19 ENCOUNTER — Ambulatory Visit (HOSPITAL_BASED_OUTPATIENT_CLINIC_OR_DEPARTMENT_OTHER): Payer: BC Managed Care – PPO | Admitting: Pharmacist

## 2013-01-19 DIAGNOSIS — Z86718 Personal history of other venous thrombosis and embolism: Secondary | ICD-10-CM

## 2013-01-19 DIAGNOSIS — Z7901 Long term (current) use of anticoagulants: Secondary | ICD-10-CM

## 2013-01-19 DIAGNOSIS — O871 Deep phlebothrombosis in the puerperium: Secondary | ICD-10-CM

## 2013-01-19 DIAGNOSIS — O2230 Deep phlebothrombosis in pregnancy, unspecified trimester: Secondary | ICD-10-CM

## 2013-01-19 LAB — PROTIME-INR: Protime: 31.2 Seconds — ABNORMAL HIGH (ref 10.6–13.4)

## 2013-01-19 NOTE — Progress Notes (Signed)
INR at goal range of 2-3.  No bleeding per pt.  Pt has been on current coumadin dose of 7.5mg  daily since 01/03/13.  Continue Coumadin 7.5mg  daily and check PT/INR in 2 weeks.

## 2013-02-01 ENCOUNTER — Ambulatory Visit (HOSPITAL_BASED_OUTPATIENT_CLINIC_OR_DEPARTMENT_OTHER): Payer: BC Managed Care – PPO | Admitting: Pharmacist

## 2013-02-01 ENCOUNTER — Ambulatory Visit (HOSPITAL_BASED_OUTPATIENT_CLINIC_OR_DEPARTMENT_OTHER): Payer: BC Managed Care – PPO | Admitting: Lab

## 2013-02-01 DIAGNOSIS — O871 Deep phlebothrombosis in the puerperium: Secondary | ICD-10-CM

## 2013-02-01 DIAGNOSIS — O2230 Deep phlebothrombosis in pregnancy, unspecified trimester: Secondary | ICD-10-CM

## 2013-02-01 DIAGNOSIS — Z86718 Personal history of other venous thrombosis and embolism: Secondary | ICD-10-CM

## 2013-02-01 DIAGNOSIS — Z7901 Long term (current) use of anticoagulants: Secondary | ICD-10-CM

## 2013-02-01 LAB — PROTIME-INR
INR: 2.6 (ref 2.00–3.50)
Protime: 31.2 s — ABNORMAL HIGH (ref 10.6–13.4)

## 2013-02-01 LAB — POCT INR: INR: 2.6

## 2013-02-01 NOTE — Patient Instructions (Signed)
Continue 7.5 mg/day  Recheck INR on 02/08/13; lab will be at 2 pm, Lonna Cobb at 2:15 and Coumadin Clinic at 2:45 pm.

## 2013-02-01 NOTE — Progress Notes (Signed)
Pt is therapeutic today at 2.6.  Will discuss length of therapy at visit with LT on 02/08/13 Continue 7.5 mg/day  Recheck INR on 02/08/13; lab will be at 2 pm, Lonna Cobb at 2:15 and Coumadin Clinic at 2:45 pm.

## 2013-02-02 ENCOUNTER — Ambulatory Visit: Payer: BC Managed Care – PPO

## 2013-02-02 ENCOUNTER — Ambulatory Visit: Payer: BC Managed Care – PPO | Admitting: Internal Medicine

## 2013-02-02 ENCOUNTER — Other Ambulatory Visit: Payer: BC Managed Care – PPO | Admitting: Lab

## 2013-02-04 ENCOUNTER — Ambulatory Visit: Payer: Self-pay | Admitting: Internal Medicine

## 2013-02-08 ENCOUNTER — Ambulatory Visit (HOSPITAL_BASED_OUTPATIENT_CLINIC_OR_DEPARTMENT_OTHER): Payer: BC Managed Care – PPO | Admitting: Nurse Practitioner

## 2013-02-08 ENCOUNTER — Telehealth: Payer: Self-pay | Admitting: Oncology

## 2013-02-08 ENCOUNTER — Ambulatory Visit: Payer: BC Managed Care – PPO | Admitting: Pharmacist

## 2013-02-08 ENCOUNTER — Other Ambulatory Visit (HOSPITAL_BASED_OUTPATIENT_CLINIC_OR_DEPARTMENT_OTHER): Payer: BC Managed Care – PPO | Admitting: Lab

## 2013-02-08 VITALS — BP 121/77 | HR 101 | Temp 98.3°F | Resp 20 | Ht 68.0 in | Wt 159.0 lb

## 2013-02-08 DIAGNOSIS — Z86718 Personal history of other venous thrombosis and embolism: Secondary | ICD-10-CM

## 2013-02-08 DIAGNOSIS — O871 Deep phlebothrombosis in the puerperium: Secondary | ICD-10-CM

## 2013-02-08 DIAGNOSIS — I82409 Acute embolism and thrombosis of unspecified deep veins of unspecified lower extremity: Secondary | ICD-10-CM

## 2013-02-08 DIAGNOSIS — Z7901 Long term (current) use of anticoagulants: Secondary | ICD-10-CM

## 2013-02-08 DIAGNOSIS — O2230 Deep phlebothrombosis in pregnancy, unspecified trimester: Secondary | ICD-10-CM

## 2013-02-08 DIAGNOSIS — I82890 Acute embolism and thrombosis of other specified veins: Secondary | ICD-10-CM

## 2013-02-08 LAB — CBC WITH DIFFERENTIAL/PLATELET
Basophils Absolute: 0 10*3/uL (ref 0.0–0.1)
EOS%: 4.2 % (ref 0.0–7.0)
Eosinophils Absolute: 0.2 10*3/uL (ref 0.0–0.5)
HCT: 41.4 % (ref 34.8–46.6)
HGB: 13.9 g/dL (ref 11.6–15.9)
MCH: 29.2 pg (ref 25.1–34.0)
MONO#: 0.4 10*3/uL (ref 0.1–0.9)
NEUT#: 2.4 10*3/uL (ref 1.5–6.5)
RDW: 13.2 % (ref 11.2–14.5)
WBC: 4.7 10*3/uL (ref 3.9–10.3)
lymph#: 1.7 10*3/uL (ref 0.9–3.3)

## 2013-02-08 LAB — PROTIME-INR
INR: 2.9 (ref 2.00–3.50)
Protime: 34.8 Seconds — ABNORMAL HIGH (ref 10.6–13.4)

## 2013-02-08 NOTE — Progress Notes (Signed)
OFFICE PROGRESS NOTE  Interval history:  [name] returns as scheduled. To review, she is a 42 year old woman who was diagnosed with an extensive left pelvic DVT a few weeks postpartum in October 2013. She required thrombolytic therapy, mechanical clot retrieval and placement of a venous stent. She was started on anticoagulation with Xarelto. She developed vaginal bleeding after a LEEP procedure and the Xarelto was stopped. She was subsequently started on Coumadin. The vaginal bleeding stopped. She then developed a viral gastroenteritis with profuse diarrhea occurring around the time the Coumadin dose was increased to 10 mg. The INR became supratherapeutic at 4.8 and she developed recurrent vaginal bleeding. She was evaluated by her gynecologist. The bleeding was coming from the uterus and not the cervix, felt likely related to hormonal breakthrough from a recent progesterone injection. She was put on oral estrogen and tapering doses. Anticoagulation was held. The vaginal bleeding stopped promptly within hours of starting the estrogen. Coumadin was resumed at 7.5 mg daily as well as a Lovenox bridge at 0.5 mg per kilogram subcutaneous twice daily. She subsequently developed swelling/tightness of the left calf. D-dimer was normal at 0.27. Venous Doppler was negative for evidence of a DVT. The INR returned at 2.3 on 01/13/2013 and Lovenox was discontinued. The INR has remained in therapeutic range.  She is seen today for scheduled followup. She continues to note intermittent tightness of the left calf and posterior thigh. She particularly noted this when running recently. Over the past 3 days she has noted mild vaginal bleeding. The bleeding does not require a pad. She denies any other bleeding. She is concerned she has a sinus infection. Symptoms are better today. She denies fever.    Objective: Blood pressure 121/77, pulse 101, temperature 98.3 F (36.8 C), temperature source Oral, resp. rate 20, height  5\' 8"  (1.727 m), weight 159 lb (72.122 kg), not currently breastfeeding.  Oropharynx is without thrush or ulceration. Lungs are clear. Regular cardiac rhythm. Abdomen is soft and nontender. No organomegaly. Extremities are without edema. Calves are nontender. The left calf measures 35 and 1/2 cm and the right calf 34 cm.  Lab Results: Lab Results  Component Value Date   WBC 4.7 02/08/2013   HGB 13.9 02/08/2013   HCT 41.4 02/08/2013   MCV 87.5 02/08/2013   PLT 149 02/08/2013    Chemistry:    Chemistry      Component Value Date/Time   NA 140 10/19/2012 0524   K 3.7 10/19/2012 0524   CL 110 10/19/2012 0524   CO2 23 10/19/2012 0524   BUN 12 10/19/2012 0524   CREATININE 0.83 10/19/2012 0524      Component Value Date/Time   CALCIUM 8.3* 10/19/2012 0524       Studies/Results: No results found.  Medications: I have reviewed the patient's current medications.  Assessment/Plan:  1. Extensive left pelvic DVT October 2013 occurring postpartum. She continues Coumadin anticoagulation. 2. Vaginal bleeding, minimal at present. Hemoglobin is stable. 3. Possible sinus infection. Symptoms are improving.  Disposition-[name] appears stable. The PT/INR remains in therapeutic range. She will continue Coumadin at the current dose of 7.5 mg daily. She knows to contact the office with any increase in vaginal bleeding. Otherwise she will return for her next visit with the Coumadin clinic on 02/16/2013 and with [name] on 03/14/2013.  Plan was discussed with [name] via telephone.  Lonna Cobb ANP/GNP-BC

## 2013-02-08 NOTE — Patient Instructions (Addendum)
Continue 7.5 mg/day  Recheck INR on 02/16/13; lab will be at 12 pm and Coumadin Clinic at 12:15 pm.

## 2013-02-08 NOTE — Progress Notes (Signed)
Pt seen by LT today.  She is therapeutic today at 2.9 on 7.5 mg daily. She is concerned that she has had 3 days of light spotting. Discussed with patient and LT and we will see if this is the return of her period since giving birth and having BC shot. Pt request she be seen next week to make sure her INR is still at goal. She is instructed to call us or Dr. Rana Snare if she experiences any heavier bleeding. Continue 7.5 mg/day  Recheck INR on 02/16/13; lab will be at 12 pm and Coumadin Clinic at 12:15 pm.

## 2013-02-14 ENCOUNTER — Other Ambulatory Visit: Payer: Self-pay | Admitting: Pharmacist

## 2013-02-14 ENCOUNTER — Ambulatory Visit (HOSPITAL_BASED_OUTPATIENT_CLINIC_OR_DEPARTMENT_OTHER): Payer: BC Managed Care – PPO | Admitting: Lab

## 2013-02-14 ENCOUNTER — Ambulatory Visit (HOSPITAL_BASED_OUTPATIENT_CLINIC_OR_DEPARTMENT_OTHER): Payer: BC Managed Care – PPO | Admitting: Pharmacist

## 2013-02-14 DIAGNOSIS — O871 Deep phlebothrombosis in the puerperium: Secondary | ICD-10-CM

## 2013-02-14 DIAGNOSIS — Z7901 Long term (current) use of anticoagulants: Secondary | ICD-10-CM

## 2013-02-14 DIAGNOSIS — O2230 Deep phlebothrombosis in pregnancy, unspecified trimester: Secondary | ICD-10-CM

## 2013-02-14 DIAGNOSIS — Z86718 Personal history of other venous thrombosis and embolism: Secondary | ICD-10-CM

## 2013-02-14 LAB — CBC WITH DIFFERENTIAL/PLATELET
Basophils Absolute: 0 10*3/uL (ref 0.0–0.1)
EOS%: 3.3 % (ref 0.0–7.0)
Eosinophils Absolute: 0.2 10*3/uL (ref 0.0–0.5)
HCT: 42.5 % (ref 34.8–46.6)
HGB: 14.3 g/dL (ref 11.6–15.9)
MCH: 29.1 pg (ref 25.1–34.0)
MCV: 86.6 fL (ref 79.5–101.0)
MONO%: 7.7 % (ref 0.0–14.0)
NEUT#: 2.3 10*3/uL (ref 1.5–6.5)
NEUT%: 47.2 % (ref 38.4–76.8)
Platelets: 129 10*3/uL — ABNORMAL LOW (ref 145–400)

## 2013-02-14 NOTE — Progress Notes (Signed)
INR = 2 on 7.5 mg/day Pltc a little low but Hgb completely normal today. Pt called this morning w/ c/o vaginal bleeding for 10 days.  Pt called her OB/GYN today (Dr. Rana Snare was not in the office) & they suggested she have INR & CBC drawn today here.  She was added into Coumadin clinic today. Pt seen last week w/ therapeutic INR on same dose of 7.5 mg.  She had vaginal "spotting."  This intensified & has persisted. She states her normal periods when she had them lasted 1 day in duration. I have discussed w/ Dr. Cyndie Chime & we will have pt continue her current dose of Coumadin without modification.  Pt advised to call her OB/GYN back & be seen for evaluation there.  I gave her today's lab results to share w/ her OB/GYN. [name] is concerned that she may need more hormones/estrogen again to get the bleeding to stop.  She is worried that her INR will be affected.  I reminded her that her Coumadin was stopped over one weekend for vaginal bleeding before the estrogen was started last time.  Estrogen does not affect INR.   We can see her again here in 3 weeks for another INR check.   [name], Pharm.D.

## 2013-02-16 ENCOUNTER — Other Ambulatory Visit: Payer: BC Managed Care – PPO | Admitting: Lab

## 2013-02-16 ENCOUNTER — Ambulatory Visit: Payer: BC Managed Care – PPO

## 2013-02-24 ENCOUNTER — Ambulatory Visit: Payer: Self-pay | Admitting: Internal Medicine

## 2013-03-07 ENCOUNTER — Ambulatory Visit (HOSPITAL_BASED_OUTPATIENT_CLINIC_OR_DEPARTMENT_OTHER): Payer: BC Managed Care – PPO | Admitting: Pharmacist

## 2013-03-07 ENCOUNTER — Other Ambulatory Visit (HOSPITAL_BASED_OUTPATIENT_CLINIC_OR_DEPARTMENT_OTHER): Payer: BC Managed Care – PPO | Admitting: Lab

## 2013-03-07 DIAGNOSIS — Z86718 Personal history of other venous thrombosis and embolism: Secondary | ICD-10-CM

## 2013-03-07 DIAGNOSIS — O2230 Deep phlebothrombosis in pregnancy, unspecified trimester: Secondary | ICD-10-CM

## 2013-03-07 DIAGNOSIS — Z7901 Long term (current) use of anticoagulants: Secondary | ICD-10-CM

## 2013-03-07 DIAGNOSIS — O871 Deep phlebothrombosis in the puerperium: Secondary | ICD-10-CM

## 2013-03-07 LAB — POCT INR: INR: 1.8

## 2013-03-07 NOTE — Patient Instructions (Signed)
Increase coumadin to 8mg  daily.  A prescription for 1mg  tablets [facility] [place] [facility] [address] Phone = [phone] Begin Lovenox 100mg  daily (1.5mg /kg). Recheck INR in 4 days on 03/11/13; lab at 2:30pm and coumadin clinic at 2:45pm.

## 2013-03-07 NOTE — Progress Notes (Signed)
INR below goal today. This may be due to starting Generess Fe. The only noted change has been Generess Fe for the last 3 weeks.  Generess Fe is an estrogen/progestin containing birth control which can diminish the effects of Coumadin.  (Also, in contrast, increased anticoagulant effects have been observed). Pt continues to have vaginal bleeding. It has been nearly constant for the last 3 weeks. Pt has f/u with Dr. Rana Snare in the next 2 weeks. Discussed with Dr. Cyndie Chime. He feels pt is at high risk for clotting with subtherapeutic INR while taking estrogen containing birth control. Will increase coumadin cautiously to 8mg  daily. [facility]in 1mg  [facility] [place] [facility] [address] Begin Lovenox 100mg  daily (1.5mg /kg/day). Pt has a few syringes. She will call us if she needs more to get her through until 03/11/13. Recheck INR in 4 days on 03/11/13. If Generess is discontinued, will most likely need to adjust coumadin to previous dose (7.5mg  daily).

## 2013-03-11 ENCOUNTER — Other Ambulatory Visit (HOSPITAL_BASED_OUTPATIENT_CLINIC_OR_DEPARTMENT_OTHER): Payer: BC Managed Care – PPO | Admitting: Lab

## 2013-03-11 ENCOUNTER — Ambulatory Visit (HOSPITAL_BASED_OUTPATIENT_CLINIC_OR_DEPARTMENT_OTHER): Payer: BC Managed Care – PPO | Admitting: Pharmacist

## 2013-03-11 DIAGNOSIS — Z7901 Long term (current) use of anticoagulants: Secondary | ICD-10-CM

## 2013-03-11 DIAGNOSIS — O871 Deep phlebothrombosis in the puerperium: Secondary | ICD-10-CM

## 2013-03-11 DIAGNOSIS — Z86718 Personal history of other venous thrombosis and embolism: Secondary | ICD-10-CM

## 2013-03-11 DIAGNOSIS — O2230 Deep phlebothrombosis in pregnancy, unspecified trimester: Secondary | ICD-10-CM

## 2013-03-11 LAB — PROTIME-INR
INR: 2.1 (ref 2.00–3.50)
Protime: 25.2 Seconds — ABNORMAL HIGH (ref 10.6–13.4)

## 2013-03-11 LAB — POCT INR: INR: 2.1

## 2013-03-11 NOTE — Progress Notes (Signed)
INR = 2.1 on Coumadin 8 mg/day & Lovenox 100 mg SQ daily Vaginal bleeding has continued.  She tried to stop the estrogen but when she stopped, the bleeding returned.  She is still on Generess FE. She got Depo-Provera today @ Dr. Vance Gather office.  She's concerned that she may have worsened bleeding after the Depo since she had that happen before. INR at goal.  D/C Lovenox. Continue Coumadin 8 mg/day Return next Thurs for protime. Ebony Hail, Pharm.D., CPP 03/11/2013@3 :00 PM

## 2013-03-14 ENCOUNTER — Ambulatory Visit: Payer: BC Managed Care – PPO | Admitting: Oncology

## 2013-03-14 ENCOUNTER — Other Ambulatory Visit: Payer: BC Managed Care – PPO | Admitting: Lab

## 2013-03-17 ENCOUNTER — Other Ambulatory Visit (HOSPITAL_BASED_OUTPATIENT_CLINIC_OR_DEPARTMENT_OTHER): Payer: BC Managed Care – PPO | Admitting: Lab

## 2013-03-17 ENCOUNTER — Ambulatory Visit (HOSPITAL_BASED_OUTPATIENT_CLINIC_OR_DEPARTMENT_OTHER): Payer: BC Managed Care – PPO | Admitting: Pharmacist

## 2013-03-17 DIAGNOSIS — O871 Deep phlebothrombosis in the puerperium: Secondary | ICD-10-CM

## 2013-03-17 DIAGNOSIS — I82409 Acute embolism and thrombosis of unspecified deep veins of unspecified lower extremity: Secondary | ICD-10-CM

## 2013-03-17 DIAGNOSIS — Z86718 Personal history of other venous thrombosis and embolism: Secondary | ICD-10-CM

## 2013-03-17 DIAGNOSIS — Z7901 Long term (current) use of anticoagulants: Secondary | ICD-10-CM

## 2013-03-17 LAB — CBC WITH DIFFERENTIAL/PLATELET
BASO%: 0.4 % (ref 0.0–2.0)
EOS%: 1.7 % (ref 0.0–7.0)
HCT: 42.6 % (ref 34.8–46.6)
LYMPH%: 42.3 % (ref 14.0–49.7)
MCH: 28.8 pg (ref 25.1–34.0)
MCHC: 34 g/dL (ref 31.5–36.0)
MONO#: 0.4 10*3/uL (ref 0.1–0.9)
NEUT%: 47.3 % (ref 38.4–76.8)
Platelets: 136 10*3/uL — ABNORMAL LOW (ref 145–400)
RBC: 5.03 10*6/uL (ref 3.70–5.45)
WBC: 5.3 10*3/uL (ref 3.9–10.3)
lymph#: 2.3 10*3/uL (ref 0.9–3.3)
nRBC: 0 % (ref 0–0)

## 2013-03-17 LAB — PROTIME-INR

## 2013-03-17 NOTE — Progress Notes (Signed)
INR within goal today. Pt has stopped the Generess Fe. Her vaginal bleeding has also stopped. No problems to report regarding anticoagulation. Continue coumadin 8mg  daily for now. Will monitor INR closely and change dose (back to 7.5mg ) as needed. Will repeat INR in 1 week. Recheck INR on 03/23/13; lab at 1 pm and coumadin clinic at 1:15pm.

## 2013-03-17 NOTE — Patient Instructions (Addendum)
Continue coumadin 8mg  daily.  Recheck INR on 03/23/13; lab at 1 pm and coumadin clinic at 1:15pm.

## 2013-03-23 ENCOUNTER — Ambulatory Visit (HOSPITAL_BASED_OUTPATIENT_CLINIC_OR_DEPARTMENT_OTHER): Payer: BC Managed Care – PPO | Admitting: Pharmacist

## 2013-03-23 ENCOUNTER — Other Ambulatory Visit: Payer: BC Managed Care – PPO | Admitting: Lab

## 2013-03-23 DIAGNOSIS — O2230 Deep phlebothrombosis in pregnancy, unspecified trimester: Secondary | ICD-10-CM

## 2013-03-23 DIAGNOSIS — O871 Deep phlebothrombosis in the puerperium: Secondary | ICD-10-CM

## 2013-03-23 DIAGNOSIS — Z86718 Personal history of other venous thrombosis and embolism: Secondary | ICD-10-CM

## 2013-03-23 DIAGNOSIS — Z7901 Long term (current) use of anticoagulants: Secondary | ICD-10-CM

## 2013-03-23 LAB — POCT INR: INR: 2

## 2013-03-23 MED ORDER — WARFARIN SODIUM 5 MG PO TABS: 7.5000 mg | ORAL_TABLET | Freq: Every day | ORAL | Status: DC

## 2013-03-23 NOTE — Patient Instructions (Signed)
Continue coumadin 8mg  daily.  Recheck INR on 03/29/13; lab at 3:45 pm and coumadin clinic at 4pm.

## 2013-03-23 NOTE — Progress Notes (Signed)
INR within goal today. No problems to report. Pt doing well. Continue coumadin 8mg  daily.  Recheck INR on 03/29/13; lab at 3:45 pm and coumadin clinic at 4pm.  Pres[facility] on [facility]in [facility] [address] [facility] [place].

## 2013-03-25 ENCOUNTER — Other Ambulatory Visit: Payer: Self-pay | Admitting: Cardiology

## 2013-03-25 DIAGNOSIS — R002 Palpitations: Secondary | ICD-10-CM

## 2013-03-25 MED ORDER — ATENOLOL 25 MG PO TABS: 25.0000 mg | ORAL_TABLET | Freq: Two times a day (BID) | ORAL | Status: DC

## 2013-03-29 ENCOUNTER — Other Ambulatory Visit (HOSPITAL_BASED_OUTPATIENT_CLINIC_OR_DEPARTMENT_OTHER): Payer: BC Managed Care – PPO | Admitting: Lab

## 2013-03-29 ENCOUNTER — Ambulatory Visit (HOSPITAL_BASED_OUTPATIENT_CLINIC_OR_DEPARTMENT_OTHER): Payer: BC Managed Care – PPO | Admitting: Pharmacist

## 2013-03-29 DIAGNOSIS — Z7901 Long term (current) use of anticoagulants: Secondary | ICD-10-CM

## 2013-03-29 DIAGNOSIS — O2230 Deep phlebothrombosis in pregnancy, unspecified trimester: Secondary | ICD-10-CM

## 2013-03-29 DIAGNOSIS — O871 Deep phlebothrombosis in the puerperium: Secondary | ICD-10-CM

## 2013-03-29 DIAGNOSIS — Z86718 Personal history of other venous thrombosis and embolism: Secondary | ICD-10-CM

## 2013-03-29 LAB — POCT INR: INR: 2.7

## 2013-03-29 LAB — PROTIME-INR: Protime: 32.4 Seconds — ABNORMAL HIGH (ref 10.6–13.4)

## 2013-03-29 NOTE — Progress Notes (Signed)
INR = 2.7 on Warfarin 8 mg daily. No bleeding. Pt leaving on trip 4/22 to [place].  We will have her back on 4/21 before her trip. No change to Coumadin dose. Ebony Hail, Pharm.D., CPP 03/29/2013@4 :37 PM

## 2013-04-09 ENCOUNTER — Other Ambulatory Visit: Payer: Self-pay | Admitting: Oncology

## 2013-04-11 ENCOUNTER — Telehealth: Payer: Self-pay | Admitting: Oncology

## 2013-04-14 IMAGING — US IR THROMB F/U EVAL ART/VEN FINAL DAY
1 series · 2 of 2 positions shown · non-contrast
Comparison: None

CLINICAL DATA: Symptomatic left lower extremity DVT.  19 days
postpartum.

LEFT LOWER EXTREMITY VENOGRAPHY
ULTRASOUND GUIDANCE FOR VENOUS ACCESS
CATHETER DIRECTED THROMBOLYTIC INFUSION
MECHANICAL THROMBECTOMY
VENOUS ANGIOPLASTY
VENOUS STENT PLACEMENT

[Series 1: ir thromb f/u eval art/ven final day · 2 of 2 slices shown]
[im 1/2]
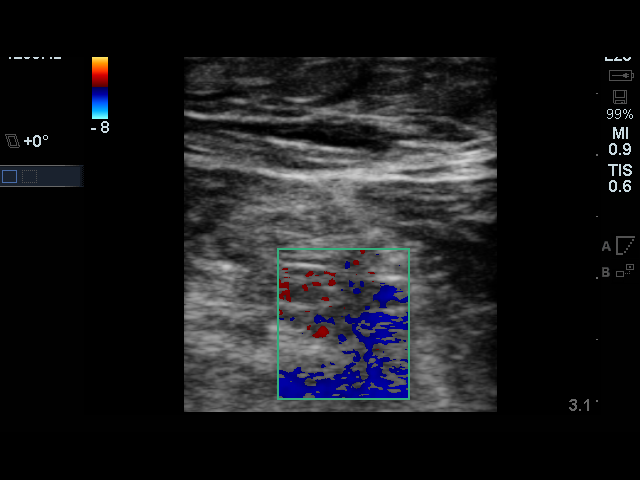
[im 2/2]
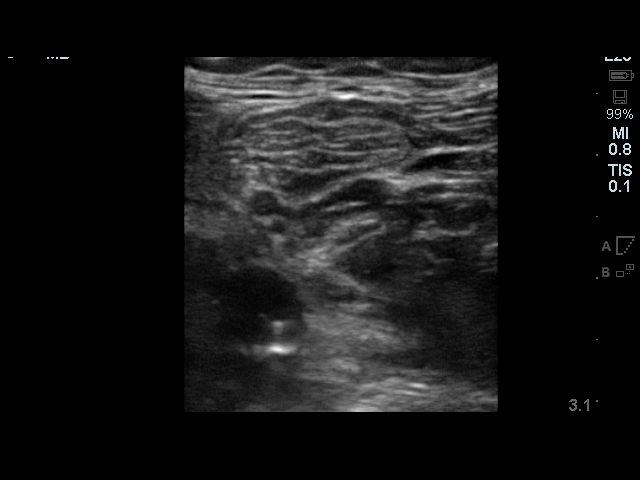

[2 of 2 positions shown; findings below may reference images not displayed]

Technique and findings: The procedure, risks (including but not
limited to bleeding, infection, organ damage), benefits, and
alternatives were explained to the patient.  Questions regarding
the procedure were encouraged and answered.  The patient
understands and consents to the procedure.

Intravenous Fentanyl and Versed were administered as conscious
sedation during continuous cardiorespiratory monitoring by the
radiology RN, with a total moderate sedation time of 128 minutes.

The left posterior popliteal region was prepped draped usual
sterile fashion. Maximal barrier sterile technique was utilized
including caps, mask, sterile gowns, sterile gloves, sterile drape,
hand hygiene and skin antiseptic.  Under real time ultrasound
guidance, the left popliteal vein below the knee was accessed with
a 21-gauge micropuncture needle in a single pass.  The guide wire
advanced easily within the lumen of the vessel, position confirmed
on fluoroscopy.  The needle was exchanged for a transitional
dilator, which allowed advancement of a Benson guide wire
centrally.  Over this, a 5-French Kumpe catheter was advanced for
left lower extremity venography.

Venography demonstrated no thrombus in the popliteal vein, minimal
residual nonocclusive thrombus in the superficial femoral vein.
There is occlusive thrombus centrally in the common femoral vein
through the left iliac venous system. There is minimal venous
collateral drainage pathways.  The right common iliac vein and IVC
remain widely patent.

The Kumpe was exchanged over guide wire for a 7-French vascular
sheath, and a vert catheter was advanced and used with the aid of
an angled glide wire to traverse the high grade obstructive lesion
at the confluence of the left iliac vein and IVC.  The vert
catheter was exchanged for the AngioJet catheter, and 10 mg TPA
were instilled through the thrombus using pulse spray technique.
The TPA was given 20 minutes for lysis.  The AngioJet catheter was
then used as a mechanical thrombectomy device to extract residual
thrombus from the common femoral and iliac veins.

Follow-up venography at this point demonstrates antegrade flow
through the iliac venous system.  There is some residual
nonocclusive thrombus in the external iliac vein.  There remains a
high grade stenotic lesion at the confluence of the left common
iliac vein with the IVC.

The 10 mm x 4 cm Conquest angioplasty balloon catheter was used to
macerate thrombus through the iliac venous system.  This balloon
would not however traverse the stenosis at the confluence with the
IVC, so a 7 mm x 4 cm Mara angioplasty balloon was then used to
traverse this lesion and partially dilate the lesion, which was
then further dilated with the 10 mm Conquest balloon.  Follow-up
venography at this point demonstrates better antegrade flow through
the iliac venous system.  There is still significant irregularity
and narrowing centrally in the common iliac vein near its
confluence with the IVC.  Some residual chronic mural thrombus is
noted in the external iliac vein.

Given this appearance   consistent with Daigo syndrome, a 14
mm x 80 mm Smart stent was deployed in the left common iliac vein
across its confluence with the IVC.  This was dilated with a 12 mm
Atlas angioplasty balloon catheter.  Residual mural thrombus in the
external iliac vein was further macerated with the 12 mm Atlas
angioplasty catheter.  Following venography showed some recoil
through the left external iliac venous system to  the common
femoral vein, but good antegrade flow with no significant
collateral filling.  Given the increased potential risk of
prolonged systemic thrombolysis due to the patient's recent
postpartum state, no additional thrombolysis was pursued.  The
catheter, sheath and guide wire were removed and hemostasis
achieved at the site. The patient tolerated the procedure well.  No
immediate complication.
IMPRESSION: 1.  High-grade stenosis at the confluence of the left iliac vein
with the IVC consistent with Daigo  syndrome.
2.  Occlusive acute thrombus through the left iliac venous system
extending to the common femoral vein.
3.  Technically successful thrombolysis and mechanical thrombectomy
of the acute left iliofemoral occlusive thrombus.
4.  Technically successful stent-assisted balloon angioplasty of
the left common iliac vein stenosis, with restoration of good
antegrade flow.
Some residual chronic thrombus remains in the left external iliac
vein.
The patient should remain on therapeutic anticoagulation for at
least 6 months if tolerated.

## 2013-04-18 ENCOUNTER — Ambulatory Visit (HOSPITAL_BASED_OUTPATIENT_CLINIC_OR_DEPARTMENT_OTHER): Payer: BC Managed Care – PPO | Admitting: Pharmacist

## 2013-04-18 ENCOUNTER — Encounter: Payer: Self-pay | Admitting: Lab

## 2013-04-18 ENCOUNTER — Other Ambulatory Visit: Payer: BC Managed Care – PPO | Admitting: Lab

## 2013-04-18 DIAGNOSIS — O871 Deep phlebothrombosis in the puerperium: Secondary | ICD-10-CM

## 2013-04-18 DIAGNOSIS — O2230 Deep phlebothrombosis in pregnancy, unspecified trimester: Secondary | ICD-10-CM

## 2013-04-18 LAB — POCT INR: INR: 2.8

## 2013-04-18 MED ORDER — WARFARIN SODIUM 4 MG PO TABS: 8.0000 mg | ORAL_TABLET | Freq: Every day | ORAL | Status: DC

## 2013-04-18 NOTE — Progress Notes (Signed)
INR within goal today. No problems to report. Pt leaving for a trip to [place] with her family on 04/19/13. Pt asked if she could have a glass of wine every couple days. This should be fine. We discussed to consume alcohol in moderation. It may increase her INR slightly.  Continue coumadin 8mg  daily.  Recheck INR on 04/25/13; lab at 1:15pm, apt with Misty Stanley at 1:45pm and coumadin clinic at 2:15 pm. [facility] [place] [facility] [address] for the 4mg  tablets. Pt will use the 4mg  tablets (2 tablets = 8mg ) each day.

## 2013-04-18 NOTE — Patient Instructions (Signed)
Continue coumadin 8mg  daily.  Recheck INR on 04/25/13; lab at 1:15pm, apt with Misty Stanley at 1:45pm and coumadin clinic at 2:15 pm. [facility] [place] on [address] for the 4mg  tablets. Take 2 of the 4mg  tablets each day. The tablets should be a blue color.

## 2013-04-25 ENCOUNTER — Ambulatory Visit: Payer: BC Managed Care – PPO

## 2013-04-25 ENCOUNTER — Ambulatory Visit (HOSPITAL_BASED_OUTPATIENT_CLINIC_OR_DEPARTMENT_OTHER): Payer: BC Managed Care – PPO | Admitting: Nurse Practitioner

## 2013-04-25 ENCOUNTER — Other Ambulatory Visit (HOSPITAL_BASED_OUTPATIENT_CLINIC_OR_DEPARTMENT_OTHER): Payer: BC Managed Care – PPO | Admitting: Lab

## 2013-04-25 VITALS — BP 119/76 | HR 79 | Temp 97.9°F | Resp 18 | Ht 68.0 in | Wt 158.0 lb

## 2013-04-25 DIAGNOSIS — I82890 Acute embolism and thrombosis of other specified veins: Secondary | ICD-10-CM

## 2013-04-25 DIAGNOSIS — O2230 Deep phlebothrombosis in pregnancy, unspecified trimester: Secondary | ICD-10-CM

## 2013-04-25 DIAGNOSIS — O871 Deep phlebothrombosis in the puerperium: Secondary | ICD-10-CM

## 2013-04-25 DIAGNOSIS — Z7901 Long term (current) use of anticoagulants: Secondary | ICD-10-CM

## 2013-04-25 LAB — PROTIME-INR
INR: 2.5 (ref 2.00–3.50)
Protime: 30 Seconds — ABNORMAL HIGH (ref 10.6–13.4)

## 2013-04-25 NOTE — Progress Notes (Signed)
OFFICE PROGRESS NOTE  Interval history:   [name] returns as scheduled. To review, she is a 42 year old woman who was diagnosed with an extensive left pelvic DVT a few weeks postpartum in October 2013. Screening for congenital or acquired etiologies for clotting tendencies were unrevealing. 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   She required thrombolytic therapy, mechanical clot retrieval and placement of a venous stent. She was started on anticoagulation with Xarelto. She developed vaginal bleeding after a LEEP procedure and the Xarelto was stopped. She was subsequently started on Coumadin. The vaginal bleeding stopped. She then developed a viral gastroenteritis with profuse diarrhea occurring around the time the Coumadin dose was increased to 10 mg. The INR became supratherapeutic at 4.8 and she developed recurrent vaginal bleeding. She was evaluated by her gynecologist. The bleeding was coming from the uterus and not the cervix, felt likely related to hormonal breakthrough from a recent progesterone injection. She was put on oral estrogen in tapering doses. Anticoagulation was held. The vaginal bleeding stopped promptly within hours of starting the estrogen. Coumadin was resumed at 7.5 mg daily as well as a Lovenox bridge at 0.5 mg per kilogram subcutaneous twice daily. She subsequently developed swelling/tightness of the left calf. D-dimer was normal at 0.27. Venous Doppler was negative for evidence of a DVT. The INR returned at 2.3 on 01/13/2013 and Lovenox was discontinued. The Coumadin is monitored through the Coumadin clinic in our office.  D-dimer was normal at 0.26 12/31/2012, 0.27 01/05/2013 and 0.22 on 02/08/2013.  She continues Coumadin. She denies bleeding. No leg swelling at present. She notes that the left leg has a tendency to swell with prolonged sitting. Since the blood clot the left calf  muscle has felt "tight". No leg pain. She intermittently notes that the left leg feels numb and tingly after sitting. This sensation resolves with movement. No shortness of breath or chest pain.   Objective: Blood pressure 119/76, pulse 79, temperature 97.9 F (36.6 C), temperature source Oral, resp. rate 18, height 5\' 8"  (1.727 m), weight 158 lb (71.668 kg), not currently breastfeeding.  No thrush or ulceration. Lungs are clear. Regular cardiac rhythm. Abdomen is soft and nontender. Extremities without edema. Calves are nontender.  Lab Results: Lab Results  Component Value Date   WBC 5.3 03/17/2013   HGB 14.5 03/17/2013   HCT 42.6 03/17/2013   MCV 84.7 03/17/2013   PLT 136* 03/17/2013    Chemistry:    Chemistry      Component Value Date/Time   NA 140 10/19/2012 0524   K 3.7 10/19/2012 0524   CL 110 10/19/2012 0524   CO2 23 10/19/2012 0524   BUN 12 10/19/2012 0524   CREATININE 0.83 10/19/2012 0524      Component Value Date/Time   CALCIUM 8.3* 10/19/2012 0524       Studies/Results: No results found.  Medications: I have reviewed the patient's current medications.  Assessment/Plan:  1. 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  2. History of vaginal bleeding. She continues close GYN followup.  Disposition-[name] has completed 6 months of anticoagulation following the left pelvic DVT October 2013. Dr. Cyndie Roach recommends discontinuation of Coumadin at this time. We did not schedule formal followup in our office. She knows she can contact the office at any time with any problems, questions or concerns.  Patient seen with Dr.  Granfortuna.  [name] ANP/GNP-BC

## 2013-05-15 IMAGING — MG MM DIGITAL DIAGNOSTIC BILAT
8 series · 8 of 8 positions shown · non-contrast
Comparison: None, baseline

CLINICAL DATA: The patient is 7 weeks postpartum.  She has a right
axillary mass, first noticed early in her pregnancy.  Following
delivery, the patient had enlargement of the mass, now somewhat
improved.

DIGITAL DIAGNOSTIC BILATERAL MAMMOGRAM WITH CAD AND RIGHT BREAST
ULTRASOUND:

[R CC (1 of 2)]
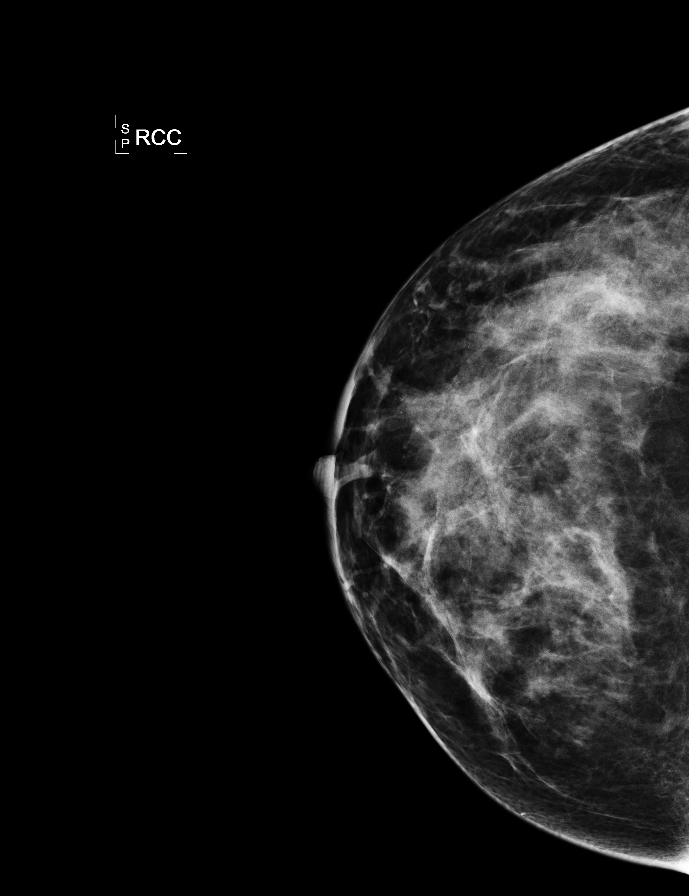

[L CC]
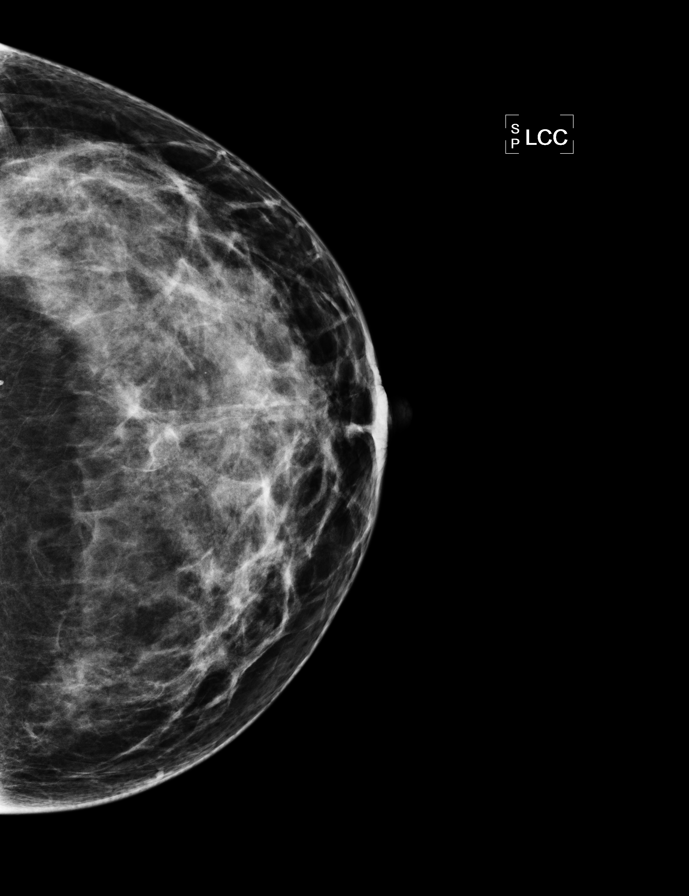

[L MLO]
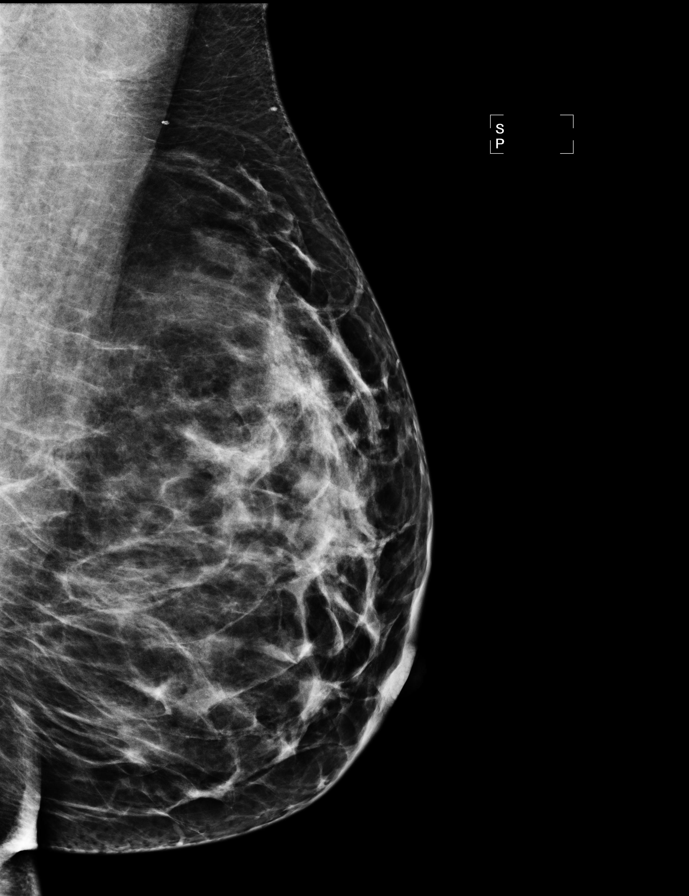

[R MLO (1 of 2)]
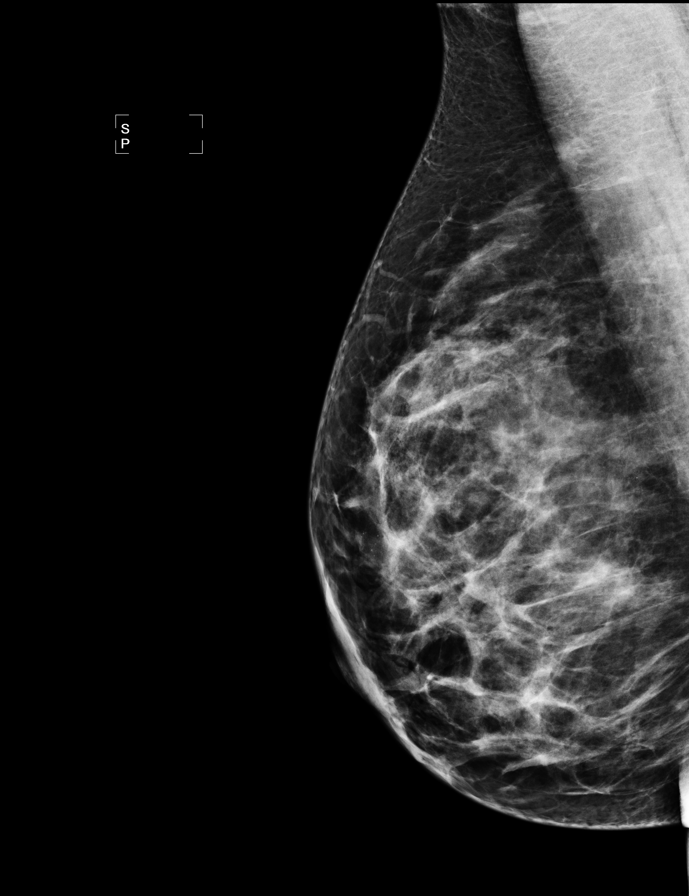

[R TAN]
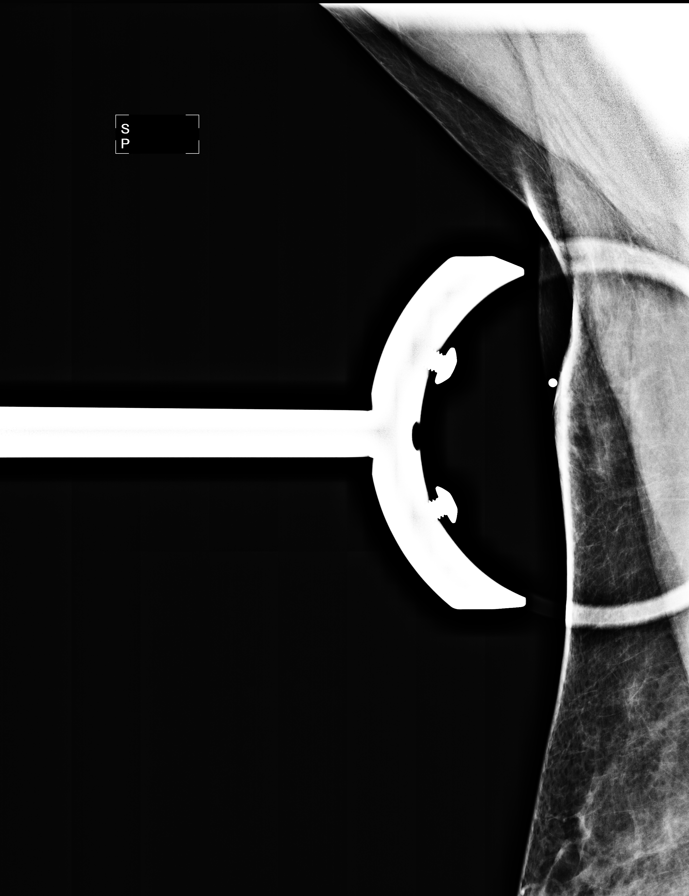

[R CC (2 of 2)]
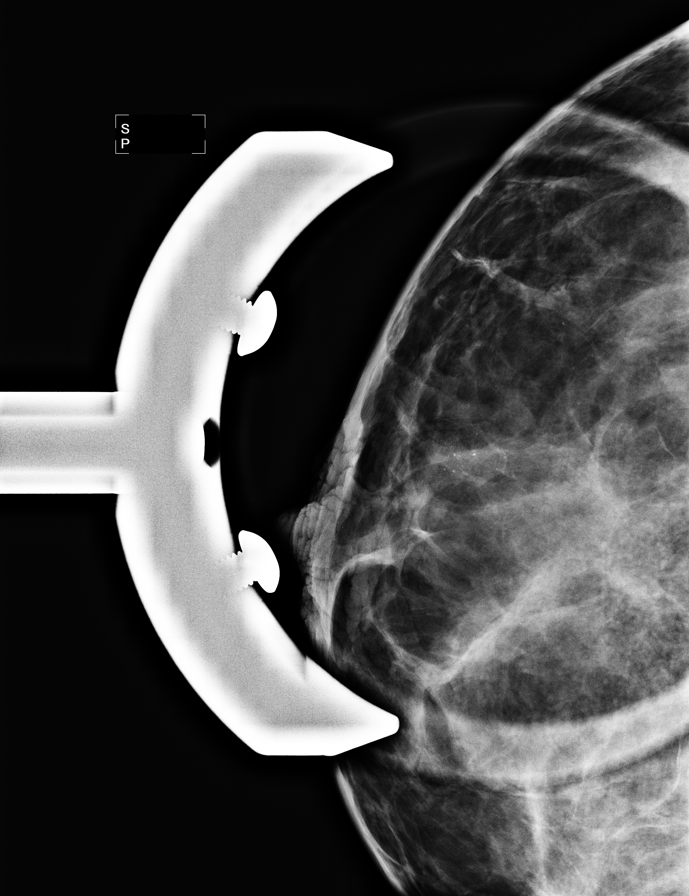

[R MLO (2 of 2)]
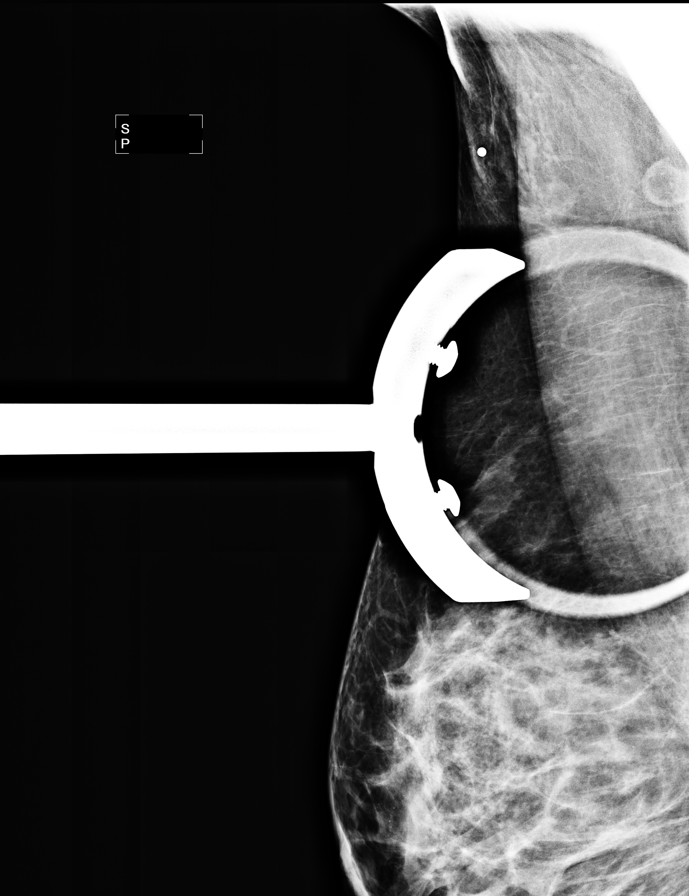

[R ML]
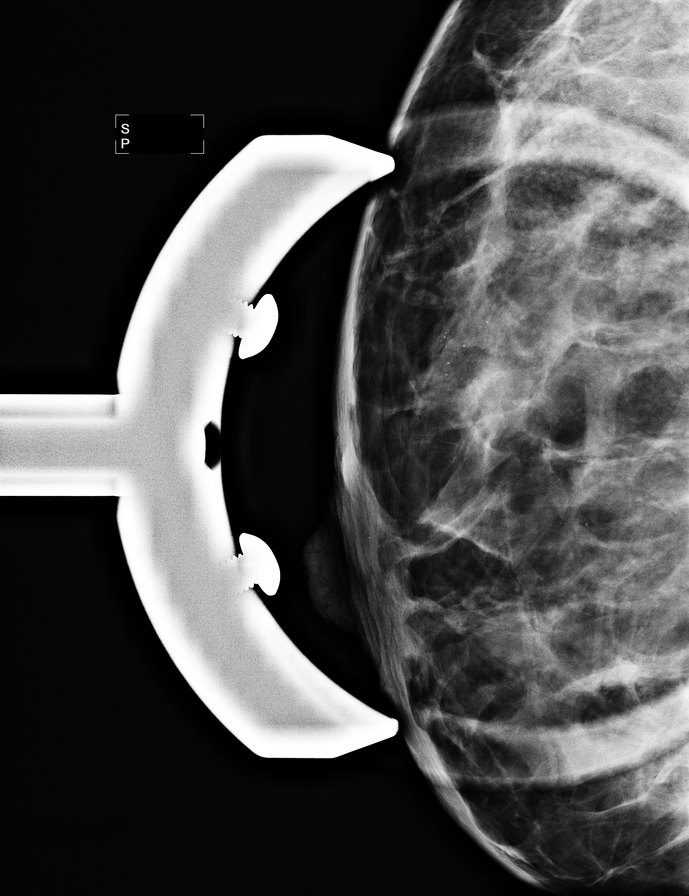

[8 of 8 positions shown; findings below may reference images not displayed]

FINDINGS: Breast parenchyma is heterogeneously dense.  There is a
grouping of calcifications just above and lateral to the right
nipple, showing layering on the true lateral view, most consistent
with milk of calcium type calcifications.  Spot tangential view of
the right axilla shows no suspicious abnormality in the area of
patient's concern.  Within this area, there is a small amount of
glandular tissue. Left breast is negative.
Mammographic images were processed with CAD.

On physical exam,  there is soft convex fullness in the right
axilla in the area of patient's concern.  I palpate no discrete
mass in this region.

Ultrasound is performed, showing normal-appearing fibroglandular
tissue within the right axilla.  No mass is identified in the area
of patient's concern.  Within the lower axilla, normal axillary
lymph nodes are imaged.

Within the 11 o'clock subareolar region of the right breast, there
is an area of fibrocystic change, correlating well with the
mammographic appearance.  Layering calcifications are identified
sonographically, consistent with mammographic findings.
IMPRESSION: 1.  Palpable abnormality is consistent with a focal area of
axillary breast tissue.  There is no evidence for malignancy in
this area or elsewhere.
2.  Benign-appearing calcifications in the 11 o'clock subareolar
region of the right breast.  These are most consistent with
fibrocystic changes.

RECOMMENDATION:
Follow-up right mammogram is suggested in 6 months to assess
stability of calcifications.

I have discussed the findings and recommendations with the patient.
Results were also provided in writing at the conclusion of the
visit.

BI-RADS CATEGORY 3:  Probably benign finding(s) - short interval
follow-up suggested.

## 2013-06-08 ENCOUNTER — Other Ambulatory Visit: Payer: Self-pay | Admitting: Obstetrics and Gynecology

## 2013-06-08 DIAGNOSIS — R921 Mammographic calcification found on diagnostic imaging of breast: Secondary | ICD-10-CM

## 2013-06-21 ENCOUNTER — Ambulatory Visit
Admission: RE | Admit: 2013-06-21 | Discharge: 2013-06-21 | Disposition: A | Discharge status: Home / Self Care | Payer: BC Managed Care – PPO | Source: Ambulatory Visit | Attending: Obstetrics and Gynecology | Admitting: Obstetrics and Gynecology

## 2013-06-21 DIAGNOSIS — R921 Mammographic calcification found on diagnostic imaging of breast: Secondary | ICD-10-CM

## 2013-11-03 ENCOUNTER — Other Ambulatory Visit: Payer: Self-pay

## 2013-11-03 DIAGNOSIS — R921 Mammographic calcification found on diagnostic imaging of breast: Secondary | ICD-10-CM

## 2013-11-16 ENCOUNTER — Other Ambulatory Visit: Payer: Self-pay | Admitting: Obstetrics and Gynecology

## 2013-11-16 DIAGNOSIS — R921 Mammographic calcification found on diagnostic imaging of breast: Secondary | ICD-10-CM

## 2013-11-22 ENCOUNTER — Ambulatory Visit
Admission: RE | Admit: 2013-11-22 | Discharge: 2013-11-22 | Disposition: A | Discharge status: Home / Self Care | Payer: BC Managed Care – PPO | Source: Ambulatory Visit

## 2013-11-22 DIAGNOSIS — R921 Mammographic calcification found on diagnostic imaging of breast: Secondary | ICD-10-CM

## 2014-02-27 ENCOUNTER — Encounter: Payer: Self-pay | Admitting: Oncology

## 2014-05-03 ENCOUNTER — Encounter: Payer: Self-pay | Admitting: Internal Medicine

## 2014-05-03 ENCOUNTER — Ambulatory Visit (INDEPENDENT_AMBULATORY_CARE_PROVIDER_SITE_OTHER): Payer: BC Managed Care – PPO | Admitting: Internal Medicine

## 2014-05-03 VITALS — BP 110/70 | HR 68 | Ht 68.0 in | Wt 160.0 lb

## 2014-05-03 DIAGNOSIS — R5383 Other fatigue: Secondary | ICD-10-CM

## 2014-05-03 DIAGNOSIS — R5381 Other malaise: Secondary | ICD-10-CM

## 2014-05-03 DIAGNOSIS — I82409 Acute embolism and thrombosis of unspecified deep veins of unspecified lower extremity: Secondary | ICD-10-CM

## 2014-05-03 DIAGNOSIS — R000 Tachycardia, unspecified: Secondary | ICD-10-CM

## 2014-05-03 DIAGNOSIS — R002 Palpitations: Secondary | ICD-10-CM

## 2014-05-03 LAB — LIPID PANEL
Cholesterol: 143 mg/dL (ref 0–200)
HDL: 29.8 mg/dL — ABNORMAL LOW (ref >39.00)
LDL CALC: 94 mg/dL (ref 0–99)
TRIGLYCERIDES: 95 mg/dL (ref 0.0–149.0)
Total CHOL/HDL Ratio: 5
VLDL: 19 mg/dL (ref 0.0–40.0)

## 2014-05-03 LAB — BASIC METABOLIC PANEL
BUN: 10 mg/dL (ref 6–23)
CHLORIDE: 109 meq/L (ref 96–112)
CO2: 27 mEq/L (ref 19–32)
CREATININE: 1 mg/dL (ref 0.4–1.2)
Calcium: 9.1 mg/dL (ref 8.4–10.5)
GFR: 67.47 mL/min (ref >60.00)
Glucose, Bld: 91 mg/dL (ref 70–99)
Potassium: 4.2 mEq/L (ref 3.5–5.1)
Sodium: 141 mEq/L (ref 135–145)

## 2014-05-03 LAB — T4, FREE: Free T4: 0.89 ng/dL (ref 0.60–1.60)

## 2014-05-03 LAB — TSH: TSH: 0.97 u[IU]/mL (ref 0.35–4.50)

## 2014-05-03 MED ORDER — ATENOLOL 25 MG PO TABS: 25.0000 mg | ORAL_TABLET | Freq: Two times a day (BID) | ORAL | Status: DC

## 2014-05-03 NOTE — Patient Instructions (Signed)
Your physician wants you to follow-up in: 12 months with Dr Allred You will receive a reminder letter in the mail two months in advance. If you don't receive a letter, please call our office to schedule the follow-up appointment.  

## 2014-05-03 NOTE — Progress Notes (Signed)
PCP: Dr Josie Dixonopeland  [name] is a 43 y.o. female who presents today for routine electrophysiology followup.  She is doing reasonably well.  Her father died suddenly in February and this has created significant grief and anxiety for her.  She seems to be doing well from a CV standpoint.  She has rare palpitations.  She exercises regularly without difficulty.  She denies any chest pain, PND, orthopnea, dizziness, or syncope.     The patient is otherwise without complaint today.   Past Medical History  Diagnosis Date  . Inappropriate sinus tachycardia   . Seasonal allergies   . Palpitations   . Fatigue   . Deep venous thrombosis 10/13    post partum  . Vaginal bleeding 12/31/2012    Intermittent since LEAP procedure complicated by need for anticoagulants  . Chronic anticoagulation 12/31/2012    For extensive pelvic DVT 10/19/12   . Diarrhea 12/31/2012    Viral gastroenteritis? Started 12/27/12  DVT  Past Surgical History  Procedure Laterality Date  . Wisdom tooth extraction    . Coronary angioplasty      Current Outpatient Prescriptions  Medication Sig Dispense Refill  . acetaminophen (TYLENOL) 500 MG tablet Take 1,000 mg by mouth every 6 (six) hours as needed. pain      . atenolol (TENORMIN) 25 MG tablet Take 1 tablet (25 mg total) by mouth 2 (two) times daily.  180 tablet  3  . cetirizine (ZYRTEC) 10 MG tablet Take 10 mg by mouth daily.       No current facility-administered medications for this visit.    Physical Exam: Filed Vitals:   05/03/14 0831  BP: 110/70  Pulse: 68  Height: 5\' 8"  (1.727 m)  Weight: 160 lb (72.576 kg)    GEN- The patient is well appearing, alert and oriented x 3 today.   Head- normocephalic, atraumatic Eyes-  Sclera clear, conjunctiva pink Ears- hearing intact Oropharynx- clear Neck- very prominent thyroid (she states that this is chronic) Lungs- Clear to ausculation bilaterally, normal work of breathing Heart- Regular rate and rhythm, no  murmurs, rubs or gallops, PMI not laterally displaced GI- soft, NT, ND, + BS Extremities- no clubbing, cyanosis, or edema  ekg today reveals sinus rhythm 79 bpm  Assessment and Plan:  1. Sinus tachycardia Well controlled Decrease atenolol to 25mg  daily Tsh/t4 today  2. Routine health maintenance Check lipids today, also bmet

## 2014-09-12 ENCOUNTER — Other Ambulatory Visit: Payer: Self-pay | Admitting: Internal Medicine

## 2014-09-15 DIAGNOSIS — L509 Urticaria, unspecified: Secondary | ICD-10-CM | POA: Insufficient documentation

## 2014-09-15 DIAGNOSIS — J069 Acute upper respiratory infection, unspecified: Secondary | ICD-10-CM | POA: Insufficient documentation

## 2014-09-21 ENCOUNTER — Other Ambulatory Visit: Payer: Self-pay | Admitting: Internal Medicine

## 2014-10-02 ENCOUNTER — Other Ambulatory Visit: Payer: Self-pay | Admitting: *Deleted

## 2014-10-02 DIAGNOSIS — R002 Palpitations: Secondary | ICD-10-CM

## 2014-10-02 MED ORDER — ATENOLOL 25 MG PO TABS: 25.0000 mg | ORAL_TABLET | Freq: Two times a day (BID) | ORAL | Status: DC

## 2014-10-04 ENCOUNTER — Telehealth: Payer: Self-pay | Admitting: *Deleted

## 2014-10-04 ENCOUNTER — Other Ambulatory Visit: Payer: Self-pay | Admitting: Oncology

## 2014-10-04 ENCOUNTER — Encounter: Payer: Self-pay | Admitting: Oncology

## 2014-10-04 ENCOUNTER — Ambulatory Visit (HOSPITAL_COMMUNITY)
Admission: RE | Admit: 2014-10-04 | Discharge: 2014-10-04 | Disposition: A | Discharge status: Home / Self Care | Payer: BC Managed Care – PPO | Source: Ambulatory Visit | Attending: Vascular Surgery | Admitting: Vascular Surgery

## 2014-10-04 ENCOUNTER — Other Ambulatory Visit: Payer: Self-pay | Admitting: *Deleted

## 2014-10-04 ENCOUNTER — Telehealth: Payer: Self-pay | Admitting: Oncology

## 2014-10-04 DIAGNOSIS — M79661 Pain in right lower leg: Secondary | ICD-10-CM | POA: Diagnosis present

## 2014-10-04 DIAGNOSIS — M79609 Pain in unspecified limb: Secondary | ICD-10-CM

## 2014-10-04 DIAGNOSIS — I82401 Acute embolism and thrombosis of unspecified deep veins of right lower extremity: Secondary | ICD-10-CM

## 2014-10-04 DIAGNOSIS — M79604 Pain in right leg: Secondary | ICD-10-CM

## 2014-10-04 NOTE — Telephone Encounter (Signed)
received pof from JG in scheduling inbox requesting an imaging study. no orders are available for this pt. [facility] requesting confirmation regarding what he wants for this pt.

## 2014-10-04 NOTE — Progress Notes (Signed)
VASCULAR LAB PRELIMINARY  PRELIMINARY  PRELIMINARY  PRELIMINARY  Bilateral lower extremity venous duplex completed.    Preliminary report:  Bilateral:  No evidence of DVT, superficial thrombosis, or Baker's Cyst.  Preliminary report called to Dr. Ferne CoeGranfortuna  [name], RVS 10/04/2014, 2:01 PM

## 2014-10-04 NOTE — Telephone Encounter (Signed)
Received call from pt stating that pt had seen Dr. Cyndie ChimeGranfortuna last year for blood clot in left leg.  Pt was released by md in May 2014, and pt was no longer taking Coumadin.  Pt stated she did not have primary md; pt has been seeing only her OB/GYN md only. Pt stated she has been experiencing pain in Right leg for about 2 weeks now.  Pain from right ankle up to right leg.  Pt denied swelling, denied redness, just the pain would not go away despite pt taking Ibuprofen.  Pain also occurs with walking.  Pt stated she has been driving back and forth for her work, and she thinks she might have developed blood clot again. Dr. Cyndie ChimeGranfortuna notified.  Per Dr. Cyndie ChimeGranfortuna,  Obtain STAT Doppler study and have results called to md.  [facility]oke with [facility] in vascular lab.  Informed Cindy to call results to Dr. Cyndie ChimeGranfortuna, and to keep pt in vascular lab until further instructions from md.  Ezequiel GanserGave Cindy phone number to Dr. Cyndie ChimeGranfortuna. Attempted to call pt back several times but unable to leave messages due to phone restrictions.  Called husband  Graciela HusbandsZach Huskins x 2 and left messages x 2 on voice mail asking for pt to return nurse call ASAP. Spoke with Tiffany, lead of [facility].  Tiffany sent a message to pt via email.  Will wait for pt to return nurse call. Pt's  Phone   [phone]. Husband  Vladimir FasterZach  Marker's  Phone    [phone].  [phone] -  Pt returned nurse's call from husband's voice mail.  [facility] for Doppler study to be done at 12pm.  Instructed pt to wait for further instructions from Dr. Cyndie ChimeGranfortuna before leaving vascular lab today.  Pt voiced understanding.

## 2014-10-04 NOTE — Telephone Encounter (Signed)
per 2nd 10/7 pof pt to come in for lab 10/8 in the AM and had already been contacted. no time given on pof and unable to reach pt to confirm a time for 10/8 @ 8:15am due pt phone has call restriction that will not allow for the call to be completed.

## 2014-10-04 NOTE — Progress Notes (Unsigned)
Patient ID: [name], female   DOB: [birth year], 43 y.o.   MRN: [id number] 43 year old woman previously evaluated for a left pelvic and femoral vein thrombosis which likely occurred in her third trimester of pregnancy but was not diagnosed until she became more symptomatic postpartum. She was treated with thrombolytic therapy and a venous stent on 10/18/2012. She was anticoagulated for 6 months with Xarelto. She called this morning to report a three-week history of pain starting in the center of her right buttock and radiating down the back leg. This has occurred when she is driving. She has done a [place] She has had to stop sometimes and get out of the car to relieve the pain. She has had some soreness of her right ankle but no swelling of her thigh or calf. She has not tried to take anything for the pain. She is taking an occasional Motrin for her headache. She was concerned since the initial pelvic clot presented with buttock pain. I had a venous Doppler study of the right lower extremity done today and report was called to me by the technician. There is no evidence for an acute thrombotic event in the right lower extremity at this time. It is not clear whether or not she is experiencing sciatica. She is off for a long planned trip to [place] with her husband tomorrow. I am going to get her into the lab early to check a CBC and a d- dimer. If the d-dimer is elevated then I will put her back on anticoagulation. She still has some Lovenox at home and I told her to take those with her on the trip until she hears from me. If the d-dimer is not elevated, I am recommending that she take Motrin 400 mg 3-4 times daily for the next week or 2. If symptoms persist then further imaging will be done.

## 2014-10-05 ENCOUNTER — Other Ambulatory Visit: Payer: Self-pay | Admitting: Oncology

## 2014-10-05 ENCOUNTER — Other Ambulatory Visit (HOSPITAL_BASED_OUTPATIENT_CLINIC_OR_DEPARTMENT_OTHER): Payer: BC Managed Care – PPO

## 2014-10-05 DIAGNOSIS — I82401 Acute embolism and thrombosis of unspecified deep veins of right lower extremity: Secondary | ICD-10-CM

## 2014-10-05 DIAGNOSIS — M79604 Pain in right leg: Secondary | ICD-10-CM

## 2014-10-05 DIAGNOSIS — M7918 Myalgia, other site: Secondary | ICD-10-CM

## 2014-10-05 LAB — CBC WITH DIFFERENTIAL/PLATELET
BASO%: 0.2 % (ref 0.0–2.0)
Basophils Absolute: 0 10*3/uL (ref 0.0–0.1)
EOS ABS: 0.1 10*3/uL (ref 0.0–0.5)
EOS%: 2.4 % (ref 0.0–7.0)
HCT: 43.4 % (ref 34.8–46.6)
HGB: 15.2 g/dL (ref 11.6–15.9)
LYMPH#: 1.7 10*3/uL (ref 0.9–3.3)
LYMPH%: 37.4 % (ref 14.0–49.7)
MCH: 30.5 pg (ref 25.1–34.0)
MCHC: 35 g/dL (ref 31.5–36.0)
MCV: 87 fL (ref 79.5–101.0)
MONO#: 0.3 10*3/uL (ref 0.1–0.9)
MONO%: 7.4 % (ref 0.0–14.0)
NEUT#: 2.4 10*3/uL (ref 1.5–6.5)
NEUT%: 52.6 % (ref 38.4–76.8)
Platelets: 132 10*3/uL — ABNORMAL LOW (ref 145–400)
RBC: 4.99 10*6/uL (ref 3.70–5.45)
RDW: 12.2 % (ref 11.2–14.5)
WBC: 4.6 10*3/uL (ref 3.9–10.3)

## 2014-10-05 LAB — D-DIMER, QUANTITATIVE: D-Dimer, Quant: 0.64 ug/mL-FEU — ABNORMAL HIGH (ref 0.00–0.48)

## 2014-10-09 ENCOUNTER — Other Ambulatory Visit: Payer: Self-pay | Admitting: Oncology

## 2014-10-09 ENCOUNTER — Ambulatory Visit (HOSPITAL_COMMUNITY)
Admission: RE | Admit: 2014-10-09 | Discharge: 2014-10-09 | Disposition: A | Discharge status: Home / Self Care | Payer: BC Managed Care – PPO | Source: Ambulatory Visit | Attending: Oncology | Admitting: Oncology

## 2014-10-09 DIAGNOSIS — M7918 Myalgia, other site: Secondary | ICD-10-CM

## 2014-10-09 DIAGNOSIS — I82401 Acute embolism and thrombosis of unspecified deep veins of right lower extremity: Secondary | ICD-10-CM

## 2014-10-09 MED ORDER — GADOBENATE DIMEGLUMINE 529 MG/ML IV SOLN
20.0000 mL | Freq: Once | INTRAVENOUS | Status: AC | PRN
  Administered 2014-10-09: 20 mL via INTRAVENOUS

## 2014-10-30 ENCOUNTER — Encounter: Payer: Self-pay | Admitting: Internal Medicine

## 2015-05-08 ENCOUNTER — Other Ambulatory Visit: Payer: Self-pay | Admitting: Obstetrics and Gynecology

## 2015-05-08 DIAGNOSIS — R921 Mammographic calcification found on diagnostic imaging of breast: Secondary | ICD-10-CM

## 2015-05-14 ENCOUNTER — Other Ambulatory Visit: Payer: Self-pay | Admitting: Obstetrics and Gynecology

## 2015-05-14 ENCOUNTER — Ambulatory Visit
Admission: RE | Admit: 2015-05-14 | Discharge: 2015-05-14 | Disposition: A | Discharge status: Home / Self Care | Payer: Self-pay | Source: Ambulatory Visit | Attending: Obstetrics and Gynecology | Admitting: Obstetrics and Gynecology

## 2015-05-14 DIAGNOSIS — R921 Mammographic calcification found on diagnostic imaging of breast: Secondary | ICD-10-CM

## 2015-09-11 ENCOUNTER — Other Ambulatory Visit: Payer: Self-pay | Admitting: Orthopaedic Surgery

## 2015-09-11 DIAGNOSIS — M25552 Pain in left hip: Secondary | ICD-10-CM

## 2016-08-22 ENCOUNTER — Encounter: Payer: Self-pay | Admitting: Physician Assistant

## 2016-08-24 NOTE — Progress Notes (Signed)
Cardiology Office Note    Date:  08/25/2016   ID:  [name], DOB 03/17/1971, MRN [id number]  PCP:  [name] Per Patient  Cardiologist:  Dr. Johney FrameAllred  CC: chest pain  History of Present Illness:  [name] is a 45 y.o. 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   She was diagnosed with an extensive left pelvic DVT a few weeks postpartum in October 2013. Screening for congenital or acquired etiologies for clotting tendencies were unrevealing. It was presumed given the anatomic findings at the time of venogram and thrombolytic therapy the clot was due to May Thurner syndrome. She required thrombolytic therapy, mechanical clot retrieval and placement of a venous stent. She was started on anticoagulation with Xarelto. She developed vaginal bleeding after a LEEP procedure and the Xarelto was stopped. She was subsequently started on Coumadin. She eventually was taken off of coumadin after 6 months of therapy.   She also has a history of inappropriate sinus tachycardia dating back to a pregnancy in 2002 at [facility].  This had previously been controlled with atenolol therapy and she has done well over the years. She was switched to labetelol during a subsequent pregnancy in 2013 but is now back on atenolol. She was last seen by Dr. Johney FrameAllred in 07/2014 and felt to be doing well from a cardiac standpoint. Her atenolol was decreased to 25mg  daily. TSH and lipid panel were okay, except she had a low HDL.  She had a mildly elevated DDimer 0.6 in 09/2014 and leg and buttock pain. She had a MR of her pelvis which showed no DVT and patent stent.   Today she presents to clinic for evaluation of chest pain x 3 weeks. She stopped taking the atenolol about 1 year ago because she was sick of taking medicines and going to Drs. For the last three weeks she has noticed chest  pain that is worse with a deep breath in and notices most while at rest. She sees a trainer twice a week and walks 2-3 miles a day with no exertional symptom. She recently [place] in early [place]. She has noticed the chest pain since around Aug 4-5th. It has been intermittent. She mostly notices it with a deep breath in. It is a sharp pain in her left chest. Not worse with exertion. Her breathing has been okay. Has has some   Father died of a heart attack at the age of 43 due to heart related problems. He has his first MI in his 3640s and he did not smoke, was not overweight and exercise. Paternal GM had two heart attacks later in 60-70s. Mothers heart disease has no heart disease.     Past Medical History:  Diagnosis Date  . Arrhythmia   . Chronic anticoagulation 12/31/2012   For extensive pelvic DVT 10/19/12   . Deep venous thrombosis (HCC) 10/13   post partum  . Diarrhea 12/31/2012   Viral gastroenteritis? Started 12/27/12  . Fatigue   . Inappropriate sinus tachycardia (HCC)   . Palpitations   . Seasonal allergies   . Vaginal bleeding 12/31/2012   Intermittent since LEAP procedure complicated by need for anticoagulants    Past Surgical History:  Procedure Laterality Date  . CORONARY ANGIOPLASTY    . WISDOM TOOTH EXTRACTION      Current  Medications: Outpatient Medications Prior to Visit  Medication Sig Dispense Refill  . acetaminophen (TYLENOL) 500 MG tablet Take 1,000 mg by mouth every 6 (six) hours as needed. pain    . cetirizine (ZYRTEC) 10 MG tablet Take 10 mg by mouth daily.    Marland Kitchen atenolol (TENORMIN) 25 MG tablet Take 1 tablet (25 mg total) by mouth 2 (two) times daily. 180 tablet 2   No facility-administered medications prior to visit.      Allergies:   Cheese; Metoprolol; Moxifloxacin; Avelox [moxifloxacin hcl in nacl]; Toprol xl [metoprolol succinate]; and Zithromax [azithromycin]   Social History   Social History  . Marital status:  Married    Spouse name: N/A  . Number of children: N/A  . Years of education: N/A   Social History Main Topics  . Smoking status: Never Smoker  . Smokeless tobacco: Never Used  . Alcohol use No  . Drug use: No  . Sexual activity: Not Currently    Birth control/ protection: Pill, Injection   Other Topics Concern  . 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     Family History:  The patient's family history includes Diabetes in her brother and father; Heart attack in her father and maternal grandfather; Hypertension in her brother and father.     ROS:   Please see the history of present illness.    ROS All other systems reviewed and are negative.   PHYSICAL EXAM:   VS:  BP (!) 122/100   Pulse (!) 119   Ht 5\' 8"  (1.727 m)   Wt 166 lb (75.3 kg)   SpO2 96%   BMI 25.24 kg/m    GEN: Well nourished, well developed, in no acute distress  HEENT: normal  Neck: no JVD, carotid bruits, or masses Cardiac: RRR; no murmurs, rubs, or gallops,no edema  Respiratory:  clear to auscultation bilaterally, normal work of breathing GI: soft, nontender, nondistended, + BS MS: no deformity or atrophy  Skin: warm and dry, no rash Neuro:  Alert and Oriented x 3, Strength and sensation are intact Psych: euthymic mood, full affect  Wt Readings from Last 3 Encounters:  08/25/16 166 lb (75.3 kg)  05/03/14 160 lb (72.6 kg)  04/25/13 158 lb (71.7 kg)      Studies/Labs Reviewed:   EKG:  EKG is ordered today.  The ekg ordered today demonstrates NSR similar to previous HR 93   Recent Labs: No results found for requested labs within last 8760 hours.   Lipid Panel    Component Value Date/Time   CHOL 143 05/03/2014 0924   TRIG 95.0 05/03/2014 0924   HDL 29.80 (L) 05/03/2014 0924   CHOLHDL 5 05/03/2014 0924   VLDL 19.0 05/03/2014 0924   LDLCALC 94 05/03/2014 0924    Additional studies/ records that were reviewed today include:  2D ECHO: 05/17/2012 LV EF:  60% -  65% Study Conclusions Left ventricle: The cavity size was normal. Wall thickness was normal. Systolic function was normal. The estimated ejection fraction was in the range of 60% to 65%. Wall motion was normal; there were no regional wall motion abnormalities.       ASSESSMENT & PLAN:   Chest pain: her chest pain is atypical for cardiac chest pain. I am worried about a DVT/PE with her history of May Thurners syndrome and recent car trips. Leg exam is normal. Given her family hx of CAD in her father will plan  a POET once we rule out PE.  Inappropriate sinus tachy: will start Toprol XL 25mg  daily. She has an allergy to this in chart but patient said she has taken before and tolerated it fine.   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 Will get a CT to rule out PE given pleuritic chest pain and recent car travel   BP high at 122/100 but my recheck is normal at 120/80  Medication Adjustments/Labs and Tests Ordered: Current medicines are reviewed at length with the patient today.  Concerns regarding medicines are outlined above.  Medication changes, Labs and Tests ordered today are listed in the Patient Instructions below. Patient Instructions  Medication Instructions:  Your physician has recommended you make the following change in your medication:  1.  START Toprol XL 25 mg taking 1 tablet daily   Labwork: None ordered  Testing/Procedures: Non-Cardiac CT Angiography (CTA) TODAY , is a special type of CT scan that uses a computer to produce multi-dimensional views of major blood vessels throughout the body. In CT angiography, a contrast material is injected through an IV to help visualize the blood vessels   Follow-Up: Your physician recommends that you schedule a follow-up appointment WILL BE BASED UPON YOUR TEST RESULTS   Any Other Special Instructions Will Be Listed Below (If Applicable).     If you need a refill on your cardiac medications before  your next appointment, please call your pharmacy.      Signed, Cline Crock, PA-C  08/25/2016 9:47 AM    [facility] [address] [place], [place], [place]  [address] Phone: [phone]; Fax: [phone]

## 2016-08-25 ENCOUNTER — Inpatient Hospital Stay: Admission: RE | Admit: 2016-08-25 | Payer: 59 | Source: Ambulatory Visit

## 2016-08-25 ENCOUNTER — Ambulatory Visit (INDEPENDENT_AMBULATORY_CARE_PROVIDER_SITE_OTHER)
Admission: RE | Admit: 2016-08-25 | Discharge: 2016-08-25 | Disposition: A | Discharge status: Home / Self Care | Payer: 59 | Source: Ambulatory Visit | Attending: Physician Assistant | Admitting: Physician Assistant

## 2016-08-25 ENCOUNTER — Ambulatory Visit
Admission: RE | Admit: 2016-08-25 | Discharge: 2016-08-25 | Disposition: A | Discharge status: Home / Self Care | Payer: 59 | Source: Ambulatory Visit | Attending: Physician Assistant | Admitting: Physician Assistant

## 2016-08-25 ENCOUNTER — Other Ambulatory Visit: Payer: Self-pay | Admitting: Physician Assistant

## 2016-08-25 ENCOUNTER — Ambulatory Visit (INDEPENDENT_AMBULATORY_CARE_PROVIDER_SITE_OTHER): Payer: 59 | Admitting: Physician Assistant

## 2016-08-25 ENCOUNTER — Encounter (INDEPENDENT_AMBULATORY_CARE_PROVIDER_SITE_OTHER): Payer: Self-pay

## 2016-08-25 ENCOUNTER — Encounter: Payer: Self-pay | Admitting: Physician Assistant

## 2016-08-25 VITALS — BP 122/100 | HR 119 | Ht 68.0 in | Wt 166.0 lb

## 2016-08-25 DIAGNOSIS — R079 Chest pain, unspecified: Secondary | ICD-10-CM

## 2016-08-25 DIAGNOSIS — I4711 Inappropriate sinus tachycardia, so stated: Secondary | ICD-10-CM

## 2016-08-25 DIAGNOSIS — Z86718 Personal history of other venous thrombosis and embolism: Secondary | ICD-10-CM

## 2016-08-25 DIAGNOSIS — R000 Tachycardia, unspecified: Secondary | ICD-10-CM

## 2016-08-25 MED ORDER — IOPAMIDOL (ISOVUE-370) INJECTION 76%
80.0000 mL | Freq: Once | INTRAVENOUS | Status: AC | PRN
  Administered 2016-08-25: 80 mL via INTRAVENOUS

## 2016-08-25 MED ORDER — METOPROLOL SUCCINATE ER 25 MG PO TB24: 25.0000 mg | ORAL_TABLET | Freq: Every day | ORAL | 3 refills | Status: DC

## 2016-08-25 NOTE — Addendum Note (Signed)
Addended by: Burnetta SabinWITTY, Vonn Sliger K on: 08/25/2016 11:46 AM   Modules accepted: Orders

## 2016-08-25 NOTE — Addendum Note (Signed)
Addended by: Burnetta SabinWITTY, Zayanna Pundt K on: 08/25/2016 10:20 AM   Modules accepted: Orders

## 2016-08-25 NOTE — Patient Instructions (Addendum)
Medication Instructions:  Your physician has recommended you make the following change in your medication:  1.  START Toprol XL 25 mg taking 1 tablet daily   Labwork: None ordered  Testing/Procedures: Non-Cardiac CT Angiography (CTA) TODAY , is a special type of CT scan that uses a computer to produce multi-dimensional views of major blood vessels throughout the body. In CT angiography, a contrast material is injected through an IV to help visualize the blood vessels   Follow-Up: Your physician recommends that you schedule a follow-up appointment WILL BE BASED UPON YOUR TEST RESULTS   Any Other Special Instructions Will Be Listed Below (If Applicable).     If you need a refill on your cardiac medications before your next appointment, please call your pharmacy.

## 2016-09-03 ENCOUNTER — Telehealth: Payer: Self-pay | Admitting: *Deleted

## 2016-09-03 ENCOUNTER — Ambulatory Visit (INDEPENDENT_AMBULATORY_CARE_PROVIDER_SITE_OTHER): Payer: 59

## 2016-09-03 DIAGNOSIS — R000 Tachycardia, unspecified: Secondary | ICD-10-CM

## 2016-09-03 DIAGNOSIS — R079 Chest pain, unspecified: Secondary | ICD-10-CM

## 2016-09-03 LAB — EXERCISE TOLERANCE TEST
CHL CUP STRESS STAGE 1 DBP: 88 mmHg
CHL CUP STRESS STAGE 1 HR: 116 {beats}/min
CHL CUP STRESS STAGE 10 GRADE: 0 %
CHL CUP STRESS STAGE 10 SPEED: 0 mph
CHL CUP STRESS STAGE 2 SPEED: 0 mph
CHL CUP STRESS STAGE 3 GRADE: 0 %
CHL CUP STRESS STAGE 3 SPEED: 0.5 mph
CHL CUP STRESS STAGE 5 GRADE: 10 %
CHL CUP STRESS STAGE 5 HR: 129 {beats}/min
CHL CUP STRESS STAGE 5 SBP: 143 mmHg
CHL CUP STRESS STAGE 5 SPEED: 1.7 mph
CHL CUP STRESS STAGE 6 DBP: 76 mmHg
CHL CUP STRESS STAGE 6 GRADE: 12 %
CHL CUP STRESS STAGE 6 SBP: 151 mmHg
CHL CUP STRESS STAGE 7 DBP: 75 mmHg
CHL CUP STRESS STAGE 7 GRADE: 14 %
CHL CUP STRESS STAGE 8 HR: 179 {beats}/min
CHL CUP STRESS STAGE 9 HR: 150 {beats}/min
CHL RATE OF PERCEIVED EXERTION: 17
CSEPPMHR: 100 %
Estimated workload: 11.7 METS
Exercise duration (min): 10 min
Exercise duration (sec): 0 s
MPHR: 178 {beats}/min
Peak HR: 179 {beats}/min
Percent HR: 100 %
Rest HR: 90 {beats}/min
Stage 1 Grade: 0 %
Stage 1 SBP: 130 mmHg
Stage 1 Speed: 0 mph
Stage 10 DBP: 84 mmHg
Stage 10 HR: 109 {beats}/min
Stage 10 SBP: 127 mmHg
Stage 2 Grade: 0 %
Stage 2 HR: 99 {beats}/min
Stage 3 HR: 102 {beats}/min
Stage 4 Grade: 0 %
Stage 4 HR: 102 {beats}/min
Stage 4 Speed: 1 mph
Stage 5 DBP: 81 mmHg
Stage 6 HR: 151 {beats}/min
Stage 6 Speed: 2.5 mph
Stage 7 HR: 169 {beats}/min
Stage 7 SBP: 148 mmHg
Stage 7 Speed: 3.4 mph
Stage 8 Grade: 16 %
Stage 8 Speed: 4.2 mph
Stage 9 DBP: 69 mmHg
Stage 9 Grade: 0 %
Stage 9 SBP: 132 mmHg
Stage 9 Speed: 1.5 mph

## 2016-09-03 NOTE — Telephone Encounter (Signed)
-----   Message from Janetta HoraKathryn R Thompson, PA-C sent at 09/03/2016  1:34 PM EDT ----- Plan for 30 day monitor. Thanks

## 2016-12-10 ENCOUNTER — Telehealth: Payer: Self-pay | Admitting: Internal Medicine

## 2016-12-10 NOTE — Telephone Encounter (Signed)
Patient has a questions about MRI that was done around her last visit with us.

## 2016-12-11 ENCOUNTER — Other Ambulatory Visit (HOSPITAL_COMMUNITY): Payer: Self-pay | Admitting: Obstetrics and Gynecology

## 2016-12-11 ENCOUNTER — Ambulatory Visit (HOSPITAL_COMMUNITY)
Admission: RE | Admit: 2016-12-11 | Discharge: 2016-12-11 | Disposition: A | Discharge status: Home / Self Care | Payer: 59 | Source: Ambulatory Visit | Attending: Obstetrics and Gynecology | Admitting: Obstetrics and Gynecology

## 2016-12-11 DIAGNOSIS — M79606 Pain in leg, unspecified: Secondary | ICD-10-CM | POA: Diagnosis not present

## 2016-12-11 DIAGNOSIS — R52 Pain, unspecified: Secondary | ICD-10-CM

## 2016-12-11 NOTE — Progress Notes (Addendum)
Cardiology Office Note    Date:  12/12/2016   ID:  Susan BlightLisa Michelle Roach, DOB 02-20-1971, MRN [id number]  PCP:  [name] Per Patient  Cardiologist:  Dr. Johney FrameAllred  CC: chest pain and SOB  History of Present Illness:  [name] is a 45 y.o. 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  She was diagnosed with an extensive left pelvic DVT a few weeks postpartum in October 2013. Screening for congenital or acquired etiologies for clotting tendencies were unrevealing. It was presumed given the anatomic findings at the time of venogram and thrombolytic therapy the clot was due to May Thurner syndrome. She required thrombolytic therapy, mechanical clot retrieval and placement of a venous stent. She was started on anticoagulation with Xarelto. She developed vaginal bleeding after a LEEP procedure and the Xarelto was stopped. She was subsequently started on Coumadin. She eventually was taken off of coumadin after 6 months of therapy.   She also has a history of inappropriate sinus tachycardia dating back to a pregnancy in 2002 at [facility]. This had previously been controlled with atenolol therapy and she has done well over the years. She was last seen by Dr. Johney FrameAllred in 07/2014 and felt to be doing well from a cardiac standpoint. Her atenolol was decreased to 25mg  daily. TSH and lipid panel were okay, except she had a low HDL.   She had a mildly elevated DDimer 0.6 in 09/2014 and leg and buttock pain. She had a MR of her pelvis which showed no DVT and patent stent.   I saw her in clinic on 08/25/16 for evaluation of chest pain and SOB. It was worse with deep inspiration and she had taken a recent car trip. I started her on Toprol XL 12.5mg  daily. With history of DVT, I got a stat CT angio which showed no PE. POET on 09/03/16 showed excellent exercise capacity and no signs of  ischemia. She called back with continued symptoms and I ordered a cardiac event monitor, but never completed. Yesterday she was evaluated with LE dopplers which were negative for DVT.   Today she presents to clinic for evaluation of chest pain and SOB. She feels like she has pressure in her chest and SOB that has been occurring almost everyday for the past couple weeks. Lasts anywhere from minutes to hours. Standing up makes it worse. Feels like she just can't get a good breath in. The Toprol XL made her chest pain worse so she stopped this. She would like to retry atenolol which she did well on in the past. Her baseline fast HR bothers her especially with exertion. Still exercising by walking and working with a trainer 2x week. No exertional symptoms except for feeling her heart race. Notices chest tightness and difficulty breathing mostly when she is sitting down. Felt like something was sitting on her chest during bible study last Tuesday. Not worse with a deep breath in. No LE edema, orthopnea or PND. No dizziness or syncope. No blood in stool or urine.    Father died of a heart attack at the age of 43 due to heart related problems. He has his first MI in his 6840s and he did not smoke, was not overweight and exercise. Paternal GM had two heart attacks later in 60-70s. Mothers heart disease has no heart disease.     Past Medical History:  Diagnosis Date  . Arrhythmia   . Chronic anticoagulation 12/31/2012   For extensive pelvic DVT 10/19/12   . Deep venous thrombosis (HCC) 10/13   post partum  . Diarrhea 12/31/2012   Viral gastroenteritis? Started 12/27/12  . Fatigue   . Inappropriate sinus tachycardia   . Palpitations   . Seasonal allergies   . Vaginal bleeding 12/31/2012   Intermittent since LEAP procedure complicated by need for anticoagulants    Past Surgical History:  Procedure Laterality Date  . CORONARY ANGIOPLASTY    . WISDOM TOOTH EXTRACTION      Current Medications: Outpatient  Medications Prior to Visit  Medication Sig Dispense Refill  . acetaminophen (TYLENOL) 500 MG tablet Take 1,000 mg by mouth every 6 (six) hours as needed. pain    . cetirizine (ZYRTEC) 10 MG tablet Take 10 mg by mouth daily.    . metoprolol succinate (TOPROL XL) 25 MG 24 hr tablet Take 1 tablet (25 mg total) by mouth daily. 90 tablet 3   No facility-administered medications prior to visit.      Allergies:   Avelox [moxifloxacin hcl in nacl]; Toprol xl [metoprolol succinate]; and Zithromax [azithromycin]   Social History   Social History  . Marital status: Married    Spouse name: N/A  . Number of children: N/A  . Years of education: N/A   Social History Main Topics  . Smoking status: Never Smoker  . Smokeless tobacco: Never Used  . Alcohol use No  . Drug use: No  . Sexual activity: Not Currently    Birth control/ protection: Pill, Injection   Other Topics Concern  . 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     Family History:  The patient's family history includes Diabetes in her brother and father; Heart attack in her father and maternal grandfather; Hypertension in her brother and father.     ROS:   Please see the history of present illness.    ROS All other systems reviewed and are negative.   PHYSICAL EXAM:   VS:  BP 102/70   Pulse (!) 102   Resp 16   Ht 5\' 8"  (1.727 m)   Wt 175 lb (79.4 kg)   LMP 11/29/2016   SpO2 99%   BMI 26.61 kg/m    GEN: Well nourished, well developed, in no acute distress  HEENT: normal  Neck: no JVD, carotid bruits, or masses Cardiac: RRR; no murmurs, rubs, or gallops,no edema  Respiratory:  clear to auscultation bilaterally, normal work of breathing GI: soft, nontender, nondistended, + BS MS: no deformity or atrophy  Skin: warm and dry, no rash Neuro:  Alert and Oriented x 3, Strength and sensation are intact Psych: euthymic mood, full affect  Wt Readings from Last 3 Encounters:  12/12/16 175  lb (79.4 kg)  08/25/16 166 lb (75.3 kg)  05/03/14 160 lb (72.6 kg)      Studies/Labs Reviewed:   EKG:  EKG is ordered today.  The ekg ordered today demonstrates NSR similar to previous HR 93   Recent Labs: No results found for requested labs within last 8760 hours.   Lipid Panel    Component Value Date/Time   CHOL 143 05/03/2014 0924   TRIG 95.0 05/03/2014 0924   HDL 29.80 (L) 05/03/2014 0924   CHOLHDL 5 05/03/2014 0924   VLDL 19.0 05/03/2014 0924   LDLCALC 94 05/03/2014 0924    Additional studies/ records that were  reviewed today include:  2D ECHO: 05/17/2012 LV EF: 60% -  65% Study Conclusions Left ventricle: The cavity size was normal. Wall thickness was normal. Systolic function was normal. The estimated ejection fraction was in the range of 60% to 65%. Wall motion was normal; there were no regional wall motion abnormalities.       POET 09/03/16 Study Highlights   Blood pressure demonstrated a blunted response to exercise.  There was no ST segment deviation noted during stress.  No ischemia noted at an excellent workload of 11.7 mets with normal HR response.  Low risk study.      ASSESSMENT & PLAN:   Chest pain: CTA 08/25/16 with no PE, POET 09/03/16 with no ischemia. Tearful and anxious appearing in exam room. ? Anxiety. Provided reassurance. Her inappropriate sinus tachycardia really bothers her. I wonder if they are related. We will start her on Bisoprolol 2.5mg  daily and see how she does. If she continues to have chest pain, we will think about cardiac CT.   Inappropriate sinus tachy: did not tolerate Toprol XL 12.5mg  daily (caused worse chest pain). Orthostatics negative and HR stable with no evidence of postural orthostatic tachycardia.  Discussed with Dr. Graciela HusbandsKlein, plan to start Bystolic 2.5mg  daily.    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 Recent CTA with no PE. Dopplers yesterday with no DVT  Anxiety: patient tearful in room. Upset  that this could be caused by anxiety. Asks if I think she is crazy  Total time spent with patient was over 40 minutes which included evaluating patient, reviewing record and coordinating care. Face to face time >50%. Discussed case with Dr. Graciela HusbandsKlein, we did orthostatics and spent a lot of time reassuring the tearful and anxious patient.    Medication Adjustments/Labs and Tests Ordered: Current medicines are reviewed at length with the patient today.  Concerns regarding medicines are outlined above.  Medication changes, Labs and Tests ordered today are listed in the Patient Instructions below. Patient Instructions  Medication Instructions:  Your physician has recommended you make the following change in your medication:  1.  START Bystolic 2.5 mg taking 1 tablet daily   Labwork: None ordered  Testing/Procedures: None ordered  Follow-Up: Your physician recommends that you schedule a follow-up appointment in: 12/31/16 ARRIVE AT 8:45 FOR A 9:00 APPT WITH KATIE Jari Carollo, PA-C   Any Other Special Instructions Will Be Listed Below (If Applicable).     If you need a refill on your cardiac medications before your next appointment, please call your pharmacy.      Signed, Cline CrockKathryn Sanaya Gwilliam, PA-C  12/12/2016 12:05 PM    [facility] [address], [place], [place]  [address] Phone: [phone]; Fax: [phone]

## 2016-12-11 NOTE — Telephone Encounter (Signed)
Returned pts call.  Pt was asking about a MRI that was done back near September, and she was advised that we didn't do MRI, but we did do a CTA.  Pt wanted to know what area of the body, and I explained to her that she came in for chest pain with hx of DVT/PE and so Katie ordered CTA to r/o dvt/pe that could cause her atypical chest pain. Pt was concerned that she is having sob and that we didn't do any imaging of her legs to r/o DVT/PE and I explained to her that it was mentioned in her office note, that she wasn't having any sob, and that she was meeting with a trainer 3-4 X's a week, and stated nothing other than chest pain.  Pt was offered a appt tomorrow, 12/12/16. With Carlean JewsKatie Thompson, PA-C and pt accepted.

## 2016-12-11 NOTE — Progress Notes (Signed)
**  Preliminary report by tech**  Right lower extremity venous duplex complete. There is no evidence of deep or superficial vein thrombosis involving the right lower extremity. All visualized vessels appear patent and compressible. There is no evidence of a Baker's cyst on the right. Results were given to Gay at [phone].  12/11/16 3:41 PM Olen CordialGreg Husam Hohn RVT

## 2016-12-12 ENCOUNTER — Encounter: Payer: Self-pay | Admitting: Physician Assistant

## 2016-12-12 ENCOUNTER — Encounter (INDEPENDENT_AMBULATORY_CARE_PROVIDER_SITE_OTHER): Payer: Self-pay

## 2016-12-12 ENCOUNTER — Ambulatory Visit (INDEPENDENT_AMBULATORY_CARE_PROVIDER_SITE_OTHER): Payer: 59 | Admitting: Physician Assistant

## 2016-12-12 VITALS — BP 102/70 | HR 102 | Resp 16 | Ht 68.0 in | Wt 175.0 lb

## 2016-12-12 DIAGNOSIS — Z86718 Personal history of other venous thrombosis and embolism: Secondary | ICD-10-CM

## 2016-12-12 DIAGNOSIS — R079 Chest pain, unspecified: Secondary | ICD-10-CM | POA: Diagnosis not present

## 2016-12-12 DIAGNOSIS — R000 Tachycardia, unspecified: Secondary | ICD-10-CM

## 2016-12-12 DIAGNOSIS — F419 Anxiety disorder, unspecified: Secondary | ICD-10-CM

## 2016-12-12 MED ORDER — NEBIVOLOL HCL 2.5 MG PO TABS: 2.5000 mg | ORAL_TABLET | Freq: Every day | ORAL | 0 refills | Status: DC

## 2016-12-12 NOTE — Patient Instructions (Addendum)
Medication Instructions:  Your physician has recommended you make the following change in your medication:  1.  START Bystolic 2.5 mg taking 1 tablet daily   Labwork: None ordered  Testing/Procedures: None ordered  Follow-Up: Your physician recommends that you schedule a follow-up appointment in: 12/31/16 ARRIVE AT 8:45 FOR A 9:00 APPT WITH KATIE THOMPSON, PA-C   Any Other Special Instructions Will Be Listed Below (If Applicable).     If you need a refill on your cardiac medications before your next appointment, please call your pharmacy.

## 2016-12-15 NOTE — Addendum Note (Signed)
Addended by: Burnetta SabinWITTY, Bralyn Espino K on: 12/15/2016 12:22 PM   Modules accepted: Orders

## 2016-12-18 NOTE — Progress Notes (Signed)
Cardiology Office Note    Date:  12/31/2016   ID:  [name], DOB [birth year], MRN [id number]  PCP:  [name] Per Patient  Cardiologist:  Dr. Johney FrameAllred  CC: chest pain and SOB  History of Present Illness:  [name] is a 45 y.o. female with a history of post partum DVT felt to be due to May Thurners syndrome, inappropriate sinus tachycardia, premature CAD (father with first MI in 640s) and anxiety who presents to clinic for follow up.   She has a history of May Thurners syndrome treated wtih thrombolytic therapy, mechanical clot retrieval and placement of a venous stent. She was placed on Xarelto and then Coumadin. She also has a history of inappropriate sinus tachycardia dating back to a pregnancy in 2002, followed at [facility]. She was treated with atenolol with good success. This was eventually self discontinued by patient because she was "sick of taking medications." She has been seen by Dr. Johney FrameAllred in 07/2014.  She had a mildly elevated DDimer 0.6 in 09/2014 and leg and buttock pain. She had a MR of her pelvis which showed no DVT and patent stent.   I saw her in clinic on 08/25/16 for evaluation of chest pain and SOB. It was worse with deep inspiration and she had taken a recent car trip. I started her on Toprol XL 12.5mg  daily. With history of DVT, I got a stat CT angio which showed no PE. POET on 09/03/16 showed excellent exercise capacity and no signs of ischemia. She called back with continued symptoms and I ordered a cardiac event monitor, but never completed. She called her PCP on 12/11/16 worried about blood clots and subsequent LE dopplers were negative for DVT. She discontinued Toprol because she felt like it made the chest pain worse.   I saw her back in clinic on 12/12/16 for continued chest pain and SOB. She also felt her heart racing with exercise but no exertional chest pain or dyspnea. Orthostatics negative and HR stable with no evidence of postural orthostatic tachycardia.  I started her on Bystolic 2.5mg  daily per Dr. Odessa FlemingKlein's suggestion.   Today she presents to clinic for follow up. She has been taking the Bystolic 2.5mg  daily which hasn't helped with her symptoms that much. She actually notices chest pain after taking this (but only in the AMs?). She continues to have sporadic chest pain and SOB. Sometimes Xanax does help it but she tries to take this sparingly as she worries about addiction.  Again, she is tearful in the office and wonders if all of this is in her head. Doesn't notice feeling like her heart is racing as much. HR 74 today. Overall, her symptoms still persist but maybe not as bad. Wonders if she should try atenolol again since that worked well for her in the past. No LE edema, orthopnea or PND. No dizziness or syncope. No blood in stool or urine.    Past Medical History:  Diagnosis Date  . Arrhythmia   . Chronic anticoagulation 12/31/2012   For extensive pelvic DVT 10/19/12   . Deep venous thrombosis (HCC) 10/13   post partum  . Diarrhea 12/31/2012   Viral gastroenteritis? Started 12/27/12  . Fatigue   . Inappropriate sinus tachycardia   . Palpitations   . Seasonal allergies   . Vaginal bleeding 12/31/2012   Intermittent since LEAP procedure complicated by need for anticoagulants    Past Surgical History:  Procedure Laterality Date  . CORONARY ANGIOPLASTY    .  WISDOM TOOTH EXTRACTION      Current Medications: Outpatient Medications Prior to Visit  Medication Sig Dispense Refill  . acetaminophen (TYLENOL) 500 MG tablet Take 1,000 mg by mouth every 6 (six) hours as needed. pain    . ALPRAZolam (XANAX) 0.5 MG tablet Take 0.5 mg by mouth every 8 (eight) hours as needed for anxiety.   0  . cetirizine (ZYRTEC) 10 MG tablet Take 10 mg by mouth daily.    . nebivolol (BYSTOLIC) 2.5 MG tablet Take 1 tablet (2.5 mg total) by mouth daily. 30 tablet 0   No facility-administered medications prior to visit.      Allergies:   Avelox [moxifloxacin hcl  in nacl]; Toprol xl [metoprolol succinate]; and Zithromax [azithromycin]   Social History   Social History  . Marital status: Married    Spouse name: N/A  . Number of children: N/A  . Years of education: N/A   Social History Main Topics  . Smoking status: Never Smoker  . Smokeless tobacco: Never Used  . Alcohol use No  . Drug use: No  . Sexual activity: Not Currently    Birth control/ protection: Pill, Injection   Other Topics Concern  . 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     Family History:  The patient's family history includes Diabetes in her brother and father; Heart attack in her father and maternal grandfather; Hypertension in her brother and father.     ROS:   Please see the history of present illness.    ROS All other systems reviewed and are negative.   PHYSICAL EXAM:   VS:  BP 118/80   Pulse 75   Ht 5\' 8"  (1.727 m)   Wt 174 lb (78.9 kg)   LMP 11/29/2016   BMI 26.46 kg/m    GEN: Well nourished, well developed, in no acute distress  HEENT: normal  Neck: no JVD, carotid bruits, or masses Cardiac: RRR; no murmurs, rubs, or gallops,no edema  Respiratory:  clear to auscultation bilaterally, normal work of breathing GI: soft, nontender, nondistended, + BS MS: no deformity or atrophy  Skin: warm and dry, no rash Neuro:  Alert and Oriented x 3, Strength and sensation are intact Psych: euthymic mood, full affect  Wt Readings from Last 3 Encounters:  12/31/16 174 lb (78.9 kg)  12/12/16 175 lb (79.4 kg)  08/25/16 166 lb (75.3 kg)      Studies/Labs Reviewed:   EKG:  EKG is NOT ordered today  Recent Labs: No results found for requested labs within last 8760 hours.   Lipid Panel    Component Value Date/Time   CHOL 143 05/03/2014 0924   TRIG 95.0 05/03/2014 0924   HDL 29.80 (L) 05/03/2014 0924   CHOLHDL 5 05/03/2014 0924   VLDL 19.0 05/03/2014 0924   LDLCALC 94 05/03/2014 0924    Additional studies/ records  that were reviewed today include:  2D ECHO: 05/17/2012 LV EF: 60% -  65% Study Conclusions Left ventricle: The cavity size was normal. Wall thickness was normal. Systolic function was normal. The estimated ejection fraction was in the range of 60% to 65%. Wall motion was normal; there were no regional wall motion abnormalities.       POET 09/03/16 Study Highlights   Blood pressure demonstrated a blunted response to exercise.  There was no ST segment deviation noted during stress.  No ischemia noted at an excellent workload of 11.7 mets with  normal HR response.  Low risk study.      ASSESSMENT & PLAN:   Chest pain: CTA 08/25/16 with no PE, POET 09/03/16 with no ischemia. Tearful and anxious appearing in exam room. I think a lot of this stems from anxiety. She does take PRN alprazolam. If she continues with chest pain and SOB despite medication change to atenolol, we will pursue cardiac CT.  Inappropriate sinus tachy: did not tolerate Toprol XL 12.5mg  daily or Bisoprolol 2.5mg  (caused worse chest pain). Since she has had success with atenolol 25mg  daily in the past, we will try this again     Hx of DVT and possible May Thurners syndrome: Recent CTA with no PE. LE Dopplers 12/11/16 with no DVT  Anxiety: she may benefit from antidepressant. Continue PRN xanax.   Medication Adjustments/Labs and Tests Ordered: Current medicines are reviewed at length with the patient today.  Concerns regarding medicines are outlined above.  Medication changes, Labs and Tests ordered today are listed in the Patient Instructions below. Patient Instructions  Medication Instructions:  Stop taking Bystolic and start taking Atenolol 25 mg daily. All other medications remain the same.  Labwork: None  Testing/Procedures: None  Follow-Up: In 1 month with Carlean JewsKatie Lissette Schenk, PA.  Any Other Special Instructions Will Be Listed Below (If Applicable). If you feel better you may call us at [phone] to  cancel your upcoming apt.   If you need a refill on your cardiac medications before your next appointment, please call your pharmacy.       Signed, Cline CrockKathryn Aneesa Romey, PA-C  12/31/2016 1:11 PM       ROS

## 2016-12-31 ENCOUNTER — Ambulatory Visit (INDEPENDENT_AMBULATORY_CARE_PROVIDER_SITE_OTHER): Payer: 59 | Admitting: Physician Assistant

## 2016-12-31 ENCOUNTER — Encounter: Payer: Self-pay | Admitting: Physician Assistant

## 2016-12-31 VITALS — BP 118/80 | HR 75 | Ht 68.0 in | Wt 174.0 lb

## 2016-12-31 DIAGNOSIS — R079 Chest pain, unspecified: Secondary | ICD-10-CM | POA: Diagnosis not present

## 2016-12-31 DIAGNOSIS — F419 Anxiety disorder, unspecified: Secondary | ICD-10-CM

## 2016-12-31 DIAGNOSIS — R000 Tachycardia, unspecified: Secondary | ICD-10-CM | POA: Diagnosis not present

## 2016-12-31 DIAGNOSIS — Z86718 Personal history of other venous thrombosis and embolism: Secondary | ICD-10-CM

## 2016-12-31 MED ORDER — ATENOLOL 25 MG PO TABS: 25.0000 mg | ORAL_TABLET | Freq: Every day | ORAL | 6 refills | Status: DC

## 2016-12-31 NOTE — Patient Instructions (Signed)
**Note De-Identified Sura Canul Obfuscation** Medication Instructions:  Stop taking Bystolic and start taking Atenolol 25 mg daily. All other medications remain the same.  Labwork: None  Testing/Procedures: None  Follow-Up: In 1 month with Carlean JewsKatie Thompson, PA.  Any Other Special Instructions Will Be Listed Below (If Applicable). If you feel better you may call us at [phone] to cancel your upcoming apt.   If you need a refill on your cardiac medications before your next appointment, please call your pharmacy.

## 2017-01-05 ENCOUNTER — Other Ambulatory Visit: Payer: Self-pay | Admitting: General Surgery

## 2017-01-05 NOTE — H&P (Signed)
  History of Present Illness [name](Telitha Plath MD; 01/05/2017 11:07 AM) The patient is a 46 year old female who presents with anal pain. [age] yof who has history of what sounds mostly like external hemorrhoids but has been treating with anusol suppositories with flares. currently she has nodule present for couple weeks that became very tender, then burst and drained bloody fluid ~1 month ago. She has had pain and bleeding since then. She reports regular bowel habits and denies any history of constipation or straining. She has not noticed blood in her stools. She does notice some blood with wiping especially recently.   Problem List/Past Medical [name](Maddelyn Rocca, MD; 01/05/2017 11:06 AM) Vivien PrestoHROMBOSED EXTERNAL HEMORRHOID (K64.5)  Diagnostic Studies History [name](Rolando Hessling, MD; 01/05/2017 10:55 AM) Mammogram within last year Pap Smear 1-5 years ago  Allergies Christianne Dolin(Christen Lambert, RMA; 01/05/2017 10:44 AM) No Known Drug Allergies 12/03/2016  Medication History Christianne Dolin(Christen Lambert, RMA; 01/05/2017 10:47 AM) Tenormin (25MG  Tablet, Oral daily) Active. Xanax (0.5MG  Tablet, Oral at bedtime, as directed) Active. Tylenol (500MG  Capsule, 2 Oral as needed) Active. Medications Reconciled ZyrTEC Allergy (10MG  Tablet, Oral) Active.  Social History [name](Sheikh Leverich, MD; 01/05/2017 10:55 AM) Alcohol use Occasional alcohol use. Caffeine use Tea. No drug use Tobacco use Never smoker.  Family History [name](Donyell Carrell, MD; 01/05/2017 10:55 AM) Cerebrovascular Accident Father. Diabetes Mellitus Brother. Heart Disease Father. Heart disease in female family member before age 44  Pregnancy / Birth History [name](Rhett Najera, MD; 01/05/2017 10:55 AM) Age at menarche 15 years. Gravida 3 Length (months) of breastfeeding 7-12 Maternal age 44-25 Para 3 Regular periods  Other Problems [name](Dalayna Lauter, MD; 01/05/2017 11:06 AM) Hemorrhoids Pulmonary Embolism / Blood Clot in Legs    Vitals Christianne Dolin(Christen Lambert RMA;  01/05/2017 10:48 AM) 01/05/2017 10:47 AM Weight: 177.6 lb Height: 68in Body Surface Area: 1.94 m Body Mass Index: 27 kg/m  Temp.: 98.74F  Pulse: 70 (Regular)  BP: 100/70 (Sitting, Left Arm, Standard)      Physical Exam [name](Lequan Dobratz MD; 01/05/2017 11:07 AM)  General Mental Status-Alert. General Appearance-Cooperative.  Chest and Lung Exam Chest and lung exam reveals -on auscultation, normal breath sounds, no adventitious sounds and normal vocal resonance.  Cardiovascular Cardiovascular examination reveals -normal heart sounds, regular rate and rhythm with no murmurs.  Abdomen Palpation/Percussion Palpation and Percussion of the abdomen reveal - Soft and Non Tender.  Rectal Note: Minimal external hemorrhoid disease, fistula site noted at right posterior lateral region    Assessment & Plan [name](Jarrette Dehner MD; 01/05/2017 11:09 AM)  ANAL FISTULA (K60.3) Impression: 46 year old female who presents to the office for evaluation of hemorrhoids. On exam, her issues appear to be stemming from an anal fistula. I have recommended an exam under anesthesia with possible fistulotomy. We discussed the possibility of needing a seton as well. Risk include bleeding pain and a very small chance of incontinence and recurrence of fistulotomy is performed.

## 2017-01-14 ENCOUNTER — Encounter (HOSPITAL_BASED_OUTPATIENT_CLINIC_OR_DEPARTMENT_OTHER): Payer: Self-pay | Admitting: *Deleted

## 2017-01-14 NOTE — Progress Notes (Addendum)
NPO AFTER MN.  ARRIVE AT 0900.  NEEDS HG AND URINE PREG.  WILL TAKE XANAX, AND ZANTAC AM DOS W/ SIPS OF WATER.

## 2017-01-16 ENCOUNTER — Encounter (HOSPITAL_BASED_OUTPATIENT_CLINIC_OR_DEPARTMENT_OTHER)
Admission: RE | Disposition: A | Discharge status: Home / Self Care | Payer: Self-pay | Source: Ambulatory Visit | Attending: General Surgery

## 2017-01-16 ENCOUNTER — Encounter (HOSPITAL_BASED_OUTPATIENT_CLINIC_OR_DEPARTMENT_OTHER): Payer: Self-pay | Admitting: *Deleted

## 2017-01-16 ENCOUNTER — Ambulatory Visit (HOSPITAL_BASED_OUTPATIENT_CLINIC_OR_DEPARTMENT_OTHER)
Admission: RE | Admit: 2017-01-16 | Discharge: 2017-01-16 | Disposition: A | Discharge status: Home / Self Care | Payer: 59 | Source: Ambulatory Visit | Attending: General Surgery | Admitting: General Surgery

## 2017-01-16 ENCOUNTER — Ambulatory Visit (HOSPITAL_BASED_OUTPATIENT_CLINIC_OR_DEPARTMENT_OTHER): Payer: 59 | Admitting: Anesthesiology

## 2017-01-16 DIAGNOSIS — Z86711 Personal history of pulmonary embolism: Secondary | ICD-10-CM | POA: Insufficient documentation

## 2017-01-16 DIAGNOSIS — K603 Anal fistula: Secondary | ICD-10-CM | POA: Insufficient documentation

## 2017-01-16 DIAGNOSIS — Z79899 Other long term (current) drug therapy: Secondary | ICD-10-CM | POA: Insufficient documentation

## 2017-01-16 DIAGNOSIS — I739 Peripheral vascular disease, unspecified: Secondary | ICD-10-CM | POA: Insufficient documentation

## 2017-01-16 HISTORY — PX: EVALUATION UNDER ANESTHESIA WITH ANAL FISTULECTOMY: SHX5621

## 2017-01-16 HISTORY — DX: Compression of vein: I87.1

## 2017-01-16 HISTORY — DX: Personal history of other venous thrombosis and embolism: Z86.718

## 2017-01-16 HISTORY — DX: Reserved for inherently not codable concepts without codable children: IMO0001

## 2017-01-16 HISTORY — DX: Personal history of other diseases of the female genital tract: Z87.42

## 2017-01-16 HISTORY — DX: Anxiety disorder, unspecified: F41.9

## 2017-01-16 LAB — POCT PREGNANCY, URINE: Preg Test, Ur: NEGATIVE

## 2017-01-16 LAB — POCT HEMOGLOBIN-HEMACUE: Hemoglobin: 15.7 g/dL — ABNORMAL HIGH (ref 12.0–15.0)

## 2017-01-16 SURGERY — EXAM UNDER ANESTHESIA WITH ANAL FISTULECTOMY
Anesthesia: Monitor Anesthesia Care | Site: Rectum

## 2017-01-16 MED ORDER — MIDAZOLAM HCL 2 MG/2ML IJ SOLN
INTRAMUSCULAR | Status: AC
  Filled 2017-01-16: qty 2

## 2017-01-16 MED ORDER — SODIUM CHLORIDE 0.9 % IV SOLN
250.0000 mL | INTRAVENOUS | Status: DC | PRN
  Filled 2017-01-16: qty 250

## 2017-01-16 MED ORDER — SODIUM CHLORIDE 0.9% FLUSH
3.0000 mL | INTRAVENOUS | Status: DC | PRN
  Filled 2017-01-16: qty 3

## 2017-01-16 MED ORDER — PROPOFOL 500 MG/50ML IV EMUL
INTRAVENOUS | Status: DC | PRN
  Administered 2017-01-16: 100 ug/kg/min via INTRAVENOUS

## 2017-01-16 MED ORDER — PROPOFOL 10 MG/ML IV BOLUS
INTRAVENOUS | Status: AC
  Filled 2017-01-16: qty 60

## 2017-01-16 MED ORDER — LIDOCAINE 2% (20 MG/ML) 5 ML SYRINGE
INTRAMUSCULAR | Status: DC | PRN
  Administered 2017-01-16: 20 mg via INTRAVENOUS

## 2017-01-16 MED ORDER — PROMETHAZINE HCL 25 MG/ML IJ SOLN
6.2500 mg | INTRAMUSCULAR | Status: DC | PRN
  Filled 2017-01-16: qty 1

## 2017-01-16 MED ORDER — FENTANYL CITRATE (PF) 100 MCG/2ML IJ SOLN
INTRAMUSCULAR | Status: DC | PRN
  Administered 2017-01-16: 50 ug via INTRAVENOUS

## 2017-01-16 MED ORDER — OXYCODONE HCL 5 MG/5ML PO SOLN
5.0000 mg | Freq: Once | ORAL | Status: DC | PRN
  Filled 2017-01-16: qty 5

## 2017-01-16 MED ORDER — ACETAMINOPHEN 325 MG PO TABS
650.0000 mg | ORAL_TABLET | ORAL | Status: DC | PRN
  Filled 2017-01-16: qty 2

## 2017-01-16 MED ORDER — BUPIVACAINE-EPINEPHRINE 0.5% -1:200000 IJ SOLN
INTRAMUSCULAR | Status: DC | PRN
  Administered 2017-01-16: 30 mL

## 2017-01-16 MED ORDER — LIDOCAINE 5 % EX OINT
TOPICAL_OINTMENT | CUTANEOUS | Status: DC | PRN
  Administered 2017-01-16: 1

## 2017-01-16 MED ORDER — FENTANYL CITRATE (PF) 100 MCG/2ML IJ SOLN
25.0000 ug | INTRAMUSCULAR | Status: DC | PRN
  Filled 2017-01-16: qty 1

## 2017-01-16 MED ORDER — OXYCODONE HCL 5 MG PO TABS
5.0000 mg | ORAL_TABLET | ORAL | Status: DC | PRN
  Filled 2017-01-16: qty 2

## 2017-01-16 MED ORDER — BUPIVACAINE LIPOSOME 1.3 % IJ SUSP
INTRAMUSCULAR | Status: AC
  Filled 2017-01-16: qty 20

## 2017-01-16 MED ORDER — FENTANYL CITRATE (PF) 100 MCG/2ML IJ SOLN
INTRAMUSCULAR | Status: AC
  Filled 2017-01-16: qty 2

## 2017-01-16 MED ORDER — ACETAMINOPHEN 650 MG RE SUPP
650.0000 mg | RECTAL | Status: DC | PRN
  Filled 2017-01-16: qty 1

## 2017-01-16 MED ORDER — ONDANSETRON HCL 4 MG/2ML IJ SOLN
INTRAMUSCULAR | Status: AC
  Filled 2017-01-16: qty 2

## 2017-01-16 MED ORDER — MIDAZOLAM HCL 5 MG/5ML IJ SOLN
INTRAMUSCULAR | Status: DC | PRN
  Administered 2017-01-16 (×2): 1 mg via INTRAVENOUS

## 2017-01-16 MED ORDER — OXYCODONE HCL 5 MG PO TABS
5.0000 mg | ORAL_TABLET | Freq: Once | ORAL | Status: DC | PRN
  Filled 2017-01-16: qty 1

## 2017-01-16 MED ORDER — SODIUM CHLORIDE 0.9% FLUSH
3.0000 mL | Freq: Two times a day (BID) | INTRAVENOUS | Status: DC
Start: 2017-01-16 — End: 2017-01-16
  Filled 2017-01-16: qty 3

## 2017-01-16 MED ORDER — OXYCODONE HCL 5 MG PO TABS: 5.0000 mg | ORAL_TABLET | Freq: Four times a day (QID) | ORAL | 0 refills | Status: DC | PRN

## 2017-01-16 MED ORDER — LACTATED RINGERS IV SOLN
INTRAVENOUS | Status: DC
  Administered 2017-01-16 (×2): via INTRAVENOUS
  Filled 2017-01-16: qty 1000

## 2017-01-16 MED ORDER — ONDANSETRON HCL 4 MG/2ML IJ SOLN
INTRAMUSCULAR | Status: DC | PRN
Start: 2017-01-16 — End: 2017-01-16
  Administered 2017-01-16: 4 mg via INTRAVENOUS

## 2017-01-16 MED ORDER — PROPOFOL 10 MG/ML IV BOLUS
INTRAVENOUS | Status: DC | PRN
  Administered 2017-01-16: 30 mg via INTRAVENOUS

## 2017-01-16 SURGICAL SUPPLY — 53 items
BENZOIN TINCTURE PRP APPL 2/3 (GAUZE/BANDAGES/DRESSINGS) ×6 IMPLANT
BLADE HEX COATED 2.75 (ELECTRODE) ×3 IMPLANT
BLADE SURG 15 STRL LF DISP TIS (BLADE) IMPLANT
BLADE SURG 15 STRL SS (BLADE)
BRIEF STRETCH FOR OB PAD LRG (UNDERPADS AND DIAPERS) ×6 IMPLANT
CANISTER SUCTION 2500CC (MISCELLANEOUS) ×3 IMPLANT
COVER BACK TABLE 60X90IN (DRAPES) ×3 IMPLANT
COVER MAYO STAND STRL (DRAPES) ×3 IMPLANT
DECANTER SPIKE VIAL GLASS SM (MISCELLANEOUS) ×3 IMPLANT
DRAPE LAPAROTOMY 100X72 PEDS (DRAPES) ×3 IMPLANT
DRAPE UTILITY XL STRL (DRAPES) ×3 IMPLANT
ELECT BLADE 6.5 .24CM SHAFT (ELECTRODE) IMPLANT
ELECT REM PT RETURN 9FT ADLT (ELECTROSURGICAL) ×3
ELECTRODE REM PT RTRN 9FT ADLT (ELECTROSURGICAL) ×1 IMPLANT
GAUZE SPONGE 4X4 16PLY XRAY LF (GAUZE/BANDAGES/DRESSINGS) IMPLANT
GLOVE BIO SURGEON STRL SZ 6.5 (GLOVE) ×4 IMPLANT
GLOVE BIO SURGEONS STRL SZ 6.5 (GLOVE) ×2
GLOVE INDICATOR 7.0 STRL GRN (GLOVE) ×6 IMPLANT
GOWN STRL REUS W/ TWL LRG LVL3 (GOWN DISPOSABLE) ×1 IMPLANT
GOWN STRL REUS W/ TWL XL LVL3 (GOWN DISPOSABLE) ×2 IMPLANT
GOWN STRL REUS W/TWL LRG LVL3 (GOWN DISPOSABLE) ×2
GOWN STRL REUS W/TWL XL LVL3 (GOWN DISPOSABLE) ×4
HOOK RETRACTION 12 ELAST STAY (MISCELLANEOUS) IMPLANT
HYDROGEN PEROXIDE 16OZ (MISCELLANEOUS) ×3 IMPLANT
KIT ROOM TURNOVER WOR (KITS) ×3 IMPLANT
LOOP VESSEL MAXI BLUE (MISCELLANEOUS) IMPLANT
NDL SAFETY ECLIPSE 18X1.5 (NEEDLE) ×1 IMPLANT
NEEDLE HYPO 18GX1.5 SHARP (NEEDLE) ×2
NEEDLE HYPO 22GX1.5 SAFETY (NEEDLE) ×3 IMPLANT
NS IRRIG 500ML POUR BTL (IV SOLUTION) ×3 IMPLANT
PACK BASIN DAY SURGERY FS (CUSTOM PROCEDURE TRAY) ×3 IMPLANT
PAD ABD 8X10 STRL (GAUZE/BANDAGES/DRESSINGS) ×3 IMPLANT
PAD ARMBOARD 7.5X6 YLW CONV (MISCELLANEOUS) IMPLANT
PENCIL BUTTON HOLSTER BLD 10FT (ELECTRODE) ×3 IMPLANT
RETRACTOR STERILE 25.8CMX11.3 (INSTRUMENTS) IMPLANT
SPONGE GAUZE 4X4 12PLY STER LF (GAUZE/BANDAGES/DRESSINGS) ×3 IMPLANT
SPONGE SURGIFOAM ABS GEL 12-7 (HEMOSTASIS) IMPLANT
SUCTION FRAZIER HANDLE 10FR (MISCELLANEOUS)
SUCTION TUBE FRAZIER 10FR DISP (MISCELLANEOUS) IMPLANT
SUT CHROMIC 2 0 SH (SUTURE) ×3 IMPLANT
SUT CHROMIC 3 0 SH 27 (SUTURE) ×3 IMPLANT
SUT ETHIBOND 0 (SUTURE) IMPLANT
SUT SILK 2 0 (SUTURE)
SUT SILK 2-0 18XBRD TIE 12 (SUTURE) IMPLANT
SUT VIC AB 3-0 SH 18 (SUTURE) IMPLANT
SUT VIC AB 4-0 P-3 18XBRD (SUTURE) IMPLANT
SUT VIC AB 4-0 P3 18 (SUTURE)
SYR CONTROL 10ML LL (SYRINGE) ×3 IMPLANT
TOWEL OR 17X24 6PK STRL BLUE (TOWEL DISPOSABLE) ×3 IMPLANT
TRAY DSU PREP LF (CUSTOM PROCEDURE TRAY) ×3 IMPLANT
TUBE CONNECTING 12'X1/4 (SUCTIONS) ×1
TUBE CONNECTING 12X1/4 (SUCTIONS) ×2 IMPLANT
YANKAUER SUCT BULB TIP NO VENT (SUCTIONS) ×3 IMPLANT

## 2017-01-16 NOTE — Discharge Instructions (Addendum)

## 2017-01-16 NOTE — H&P (View-Only) (Signed)
  History of Present Illness (Susan Cuoco MD; 01/05/2017 11:07 AM) The patient is a 46 year old female who presents with anal pain. 46 yof who has history of what sounds mostly like external hemorrhoids but has been treating with anusol suppositories with flares. currently she has nodule present for couple weeks that became very tender, then burst and drained bloody fluid ~1 month ago. She has had pain and bleeding since then. She reports regular bowel habits and denies any history of constipation or straining. She has not noticed blood in her stools. She does notice some blood with wiping especially recently.   Problem List/Past Medical (Jasnoor Trussell, MD; 01/05/2017 11:06 AM) THROMBOSED EXTERNAL HEMORRHOID (K64.5)  Diagnostic Studies History (Ayaz Sondgeroth, MD; 01/05/2017 10:55 AM) Mammogram within last year Pap Smear 1-5 years ago  Allergies (Christen Lambert, RMA; 01/05/2017 10:44 AM) No Known Drug Allergies 12/03/2016  Medication History (Christen Lambert, RMA; 01/05/2017 10:47 AM) Tenormin (25MG Tablet, Oral daily) Active. Xanax (0.5MG Tablet, Oral at bedtime, as directed) Active. Tylenol (500MG Capsule, 2 Oral as needed) Active. Medications Reconciled ZyrTEC Allergy (10MG Tablet, Oral) Active.  Social History (Lu Paradise, MD; 01/05/2017 10:55 AM) Alcohol use Occasional alcohol use. Caffeine use Tea. No drug use Tobacco use Never smoker.  Family History (Saquan Furtick, MD; 01/05/2017 10:55 AM) Cerebrovascular Accident Father. Diabetes Mellitus Brother. Heart Disease Father. Heart disease in female family member before age 55  Pregnancy / Birth History (Kynlei Piontek, MD; 01/05/2017 10:55 AM) Age at menarche 15 years. Gravida 3 Length (months) of breastfeeding 7-12 Maternal age 21-25 Para 3 Regular periods  Other Problems (Kadir Azucena, MD; 01/05/2017 11:06 AM) Hemorrhoids Pulmonary Embolism / Blood Clot in Legs    Vitals (Christen Lambert RMA;  01/05/2017 10:48 AM) 01/05/2017 10:47 AM Weight: 177.6 lb Height: 68in Body Surface Area: 1.94 m Body Mass Index: 27 kg/m  Temp.: 98.4F  Pulse: 70 (Regular)  BP: 100/70 (Sitting, Left Arm, Standard)      Physical Exam (Diamon Reddinger MD; 01/05/2017 11:07 AM)  General Mental Status-Alert. General Appearance-Cooperative.  Chest and Lung Exam Chest and lung exam reveals -on auscultation, normal breath sounds, no adventitious sounds and normal vocal resonance.  Cardiovascular Cardiovascular examination reveals -normal heart sounds, regular rate and rhythm with no murmurs.  Abdomen Palpation/Percussion Palpation and Percussion of the abdomen reveal - Soft and Non Tender.  Rectal Note: Minimal external hemorrhoid disease, fistula site noted at right posterior lateral region    Assessment & Plan (Yadir Zentner MD; 01/05/2017 11:09 AM)  ANAL FISTULA (K60.3) Impression: 46-year-old female who presents to the office for evaluation of hemorrhoids. On exam, her issues appear to be stemming from an anal fistula. I have recommended an exam under anesthesia with possible fistulotomy. We discussed the possibility of needing a seton as well. Risk include bleeding pain and a very small chance of incontinence and recurrence of fistulotomy is performed. 

## 2017-01-16 NOTE — Anesthesia Preprocedure Evaluation (Signed)
Anesthesia Evaluation  Patient identified by MRN, date of birth, ID band Patient awake    Reviewed: Allergy & Precautions, NPO status , Patient's Chart, lab work & pertinent test results  Airway Mallampati: II  TM Distance: >3 FB Neck ROM: Full    Dental no notable dental hx.    Pulmonary neg pulmonary ROS,    Pulmonary exam normal breath sounds clear to auscultation       Cardiovascular + Peripheral Vascular Disease  Normal cardiovascular exam Rhythm:Regular Rate:Normal  May-Thurner syndrome  dx 10-18-2012 via transcather left leg venogram s/p thrombectomy left iliofemoral thrombus and stent/angioplasty left common iliac high grade stenosis    Neuro/Psych Anxiety negative neurological ROS     GI/Hepatic negative GI ROS, Neg liver ROS,   Endo/Other  negative endocrine ROS  Renal/GU negative Renal ROS  negative genitourinary   Musculoskeletal negative musculoskeletal ROS (+)   Abdominal   Peds negative pediatric ROS (+)  Hematology negative hematology ROS (+)   Anesthesia Other Findings   Reproductive/Obstetrics negative OB ROS                             Anesthesia Physical Anesthesia Plan  ASA: II  Anesthesia Plan: MAC   Post-op Pain Management:    Induction: Intravenous  Airway Management Planned: Simple Face Mask  Additional Equipment:   Intra-op Plan:   Post-operative Plan:   Informed Consent: I have reviewed the patients History and Physical, chart, labs and discussed the procedure including the risks, benefits and alternatives for the proposed anesthesia with the patient or authorized representative who has indicated his/her understanding and acceptance.   Dental advisory given  Plan Discussed with: CRNA and Surgeon  Anesthesia Plan Comments:         Anesthesia Quick Evaluation

## 2017-01-16 NOTE — Anesthesia Preprocedure Evaluation (Addendum)
Anesthesia Evaluation  Patient identified by MRN, date of birth, ID band Patient awake    Reviewed: Allergy & Precautions, NPO status , Patient's Chart, lab work & pertinent test results  Airway Mallampati: II  TM Distance: >3 FB Neck ROM: Full    Dental no notable dental hx.    Pulmonary neg pulmonary ROS,    Pulmonary exam normal breath sounds clear to auscultation       Cardiovascular + Peripheral Vascular Disease  Normal cardiovascular exam Rhythm:Regular Rate:Normal  May-Thurner syndrome  dx 10-18-2012 via transcather left leg venogram s/p thrombectomy left iliofemoral thrombus and stent/angioplasty left common iliac high grade stenosis    Neuro/Psych negative neurological ROS  negative psych ROS   GI/Hepatic negative GI ROS, Neg liver ROS,   Endo/Other  negative endocrine ROS  Renal/GU negative Renal ROS  negative genitourinary   Musculoskeletal negative musculoskeletal ROS (+)   Abdominal   Peds negative pediatric ROS (+)  Hematology negative hematology ROS (+)   Anesthesia Other Findings   Reproductive/Obstetrics negative OB ROS                             Anesthesia Physical Anesthesia Plan  ASA: II  Anesthesia Plan: MAC   Post-op Pain Management:    Induction: Intravenous  Airway Management Planned: Simple Face Mask  Additional Equipment:   Intra-op Plan:   Post-operative Plan:   Informed Consent: I have reviewed the patients History and Physical, chart, labs and discussed the procedure including the risks, benefits and alternatives for the proposed anesthesia with the patient or authorized representative who has indicated his/her understanding and acceptance.   Dental advisory given  Plan Discussed with: CRNA and Surgeon  Anesthesia Plan Comments:       Anesthesia Quick Evaluation

## 2017-01-16 NOTE — Anesthesia Postprocedure Evaluation (Addendum)
Anesthesia Post Note  Patient: [name]  Procedure(s) Performed: Procedure(s) (LRB): EXAM UNDER ANESTHESIA WITH POSSIBLE FISTULOTOMY, POSSIBLE SETON (N/A)  Patient location during evaluation: PACU Anesthesia Type: MAC Level of consciousness: awake and alert Pain management: pain level controlled Vital Signs Assessment: post-procedure vital signs reviewed and stable Respiratory status: spontaneous breathing, nonlabored ventilation, respiratory function stable and patient connected to nasal cannula oxygen Cardiovascular status: stable and blood pressure returned to baseline Anesthetic complications: no       Last Vitals:  Vitals:   01/16/17 1200 01/16/17 1215  BP: (!) 109/56 107/70  Pulse: 71 73  Resp: 20 17  Temp:      Last Pain:  Vitals:   01/16/17 0910  TempSrc: Oral                 Jesiel Garate S

## 2017-01-16 NOTE — Op Note (Signed)
01/16/2017  11:50 AM  PATIENT:  [name]  46 y.o. female  Patient Care Team: [name] Per Patient as PCP - General (General Practice)  PRE-OPERATIVE DIAGNOSIS:  ANAL FISTULA  POST-OPERATIVE DIAGNOSIS:  ANAL FISTULA  PROCEDURE:  Procedure(s): EXAM UNDER ANESTHESIA WITH FISTULOTOMY    Surgeon(s): Leighton Ruff, MD  ASSISTANT: none   ANESTHESIA:   local and MAC  SPECIMEN:  No Specimen  DISPOSITION OF SPECIMEN:  N/A  COUNTS:  YES  PLAN OF CARE: Discharge to home after PACU  PATIENT DISPOSITION:  PACU - hemodynamically stable.  INDICATION: 46 y.o. F with chronically draining perianal wound consistent with anal fistula   OR FINDINGS: subcutaneous anal fistula involving a right posterior skin tag  DESCRIPTION: the patient was identified in the preoperative holding area and taken to the OR where they were laid on the operating room table.  MAC anesthesia was induced without difficulty. The patient was then positioned in prone jackknife position with buttocks gently taped apart.  The patient was then prepped and draped in usual sterile fashion.  SCDs were noted to be in place prior to the initiation of anesthesia. A surgical timeout was performed indicating the correct patient, procedure, positioning and need for preoperative antibiotics.  A rectal block was performed using Marcaine with epinephrine.    I began with a digital rectal exam.  There were no abnormalities noted.  I then placed a Hill-Ferguson anoscope into the anal canal and evaluated this completely.  There were no pathologic hemorrhoids.  I used a fistula probe to identify the internal opening.  This was noted to be a subcutaneous fistula and no sphincter was involved.  I performed a fistulotomy with cautery.  The edges of the skin tag were trimmed away.  This was then marsupialized with a 3-0 Chromic suture.  A sterile dressing was applied.  The patient was awakened and sent to the PACU in stable condition.  All  counts were correct per OR staff.

## 2017-01-16 NOTE — Anesthesia Procedure Notes (Signed)
Procedure Name: [place] Performed by: ROSEIona Beard Pre-anesthesia Checklist: Patient identified, Emergency Drugs available, Suction available and Patient being monitored Oxygen Delivery Method: Simple face mask Placement Confirmation: positive ETCO2,  CO2 detector and breath sounds checked- equal and bilateral

## 2017-01-16 NOTE — Transfer of Care (Signed)
Last Vitals:  Vitals:   01/16/17 0910  BP: 113/69  Pulse: 70  Resp: 16  Temp: 36.6 C    Last Pain:  Vitals:   01/16/17 0910  TempSrc: Oral      Patients Stated Pain Goal: 5 (01/16/17 [id number])  Immediate Anesthesia Transfer of Care Note  Patient: [name]  Procedure(s) Performed: Procedure(s) (LRB): EXAM UNDER ANESTHESIA WITH POSSIBLE FISTULOTOMY, POSSIBLE SETON (N/A)  Patient Location: PACU  Anesthesia Type: General  Level of Consciousness: awake, alert  and oriented  Airway & Oxygen Therapy: Patient Spontanous Breathing and Patient connected to face mask oxygen  Post-op Assessment: Report given to PACU RN and Post -op Vital signs reviewed and stable  Post vital signs: Reviewed and stable  Complications: No apparent anesthesia complications

## 2017-01-16 NOTE — Interval H&P Note (Signed)
History and Physical Interval Note:  01/16/2017 10:18 AM  [name]  has presented today for surgery, with the diagnosis of ANAL FISTULA  The various methods of treatment have been discussed with the patient and family. After consideration of risks, benefits and other options for treatment, the patient has consented to  Procedure(s): EXAM UNDER ANESTHESIA WITH POSSIBLE FISTULOTOMY, POSSIBLE SETON (N/A) as a surgical intervention .  The patient's history has been reviewed, patient examined, no change in status, stable for surgery.  I have reviewed the patient's chart and labs.  Questions were answered to the patient's satisfaction.     Vanita PandaAlicia C Nasira Janusz, MD  Colorectal and General Surgery [facility]

## 2017-01-19 ENCOUNTER — Encounter (HOSPITAL_BASED_OUTPATIENT_CLINIC_OR_DEPARTMENT_OTHER): Payer: Self-pay | Admitting: General Surgery

## 2017-02-02 ENCOUNTER — Ambulatory Visit: Payer: 59 | Admitting: Physician Assistant

## 2017-03-02 ENCOUNTER — Ambulatory Visit (INDEPENDENT_AMBULATORY_CARE_PROVIDER_SITE_OTHER): Payer: 59 | Admitting: Orthopaedic Surgery

## 2017-03-02 ENCOUNTER — Encounter (INDEPENDENT_AMBULATORY_CARE_PROVIDER_SITE_OTHER): Payer: Self-pay | Admitting: Orthopaedic Surgery

## 2017-03-02 ENCOUNTER — Encounter (INDEPENDENT_AMBULATORY_CARE_PROVIDER_SITE_OTHER): Payer: Self-pay

## 2017-03-02 ENCOUNTER — Ambulatory Visit (INDEPENDENT_AMBULATORY_CARE_PROVIDER_SITE_OTHER): Payer: 59

## 2017-03-02 DIAGNOSIS — M25551 Pain in right hip: Secondary | ICD-10-CM | POA: Diagnosis not present

## 2017-03-02 MED ORDER — PREDNISONE 10 MG (21) PO TBPK
ORAL_TABLET | ORAL | 0 refills | Status: DC
Start: 2017-03-02 — End: 2018-05-31

## 2017-03-02 MED ORDER — METHOCARBAMOL 500 MG PO TABS: 250.0000 mg | ORAL_TABLET | Freq: Four times a day (QID) | ORAL | 2 refills | Status: DC | PRN

## 2017-03-02 MED ORDER — DICLOFENAC SODIUM 75 MG PO TBEC: 75.0000 mg | DELAYED_RELEASE_TABLET | Freq: Two times a day (BID) | ORAL | 2 refills | Status: DC

## 2017-03-02 NOTE — Progress Notes (Signed)
Office Visit Note   Patient: [name]           Date of Birth: 02/25/1971           MRN: [id number] Visit Date: 03/02/2017              Requested by: No referring provider defined for this encounter. PCP: [name] Per Patient   Assessment & Plan: Visit Diagnoses:  1. Pain in right hip     Plan: Patient likely has continued lumbar radiculitis.  Prescribed Sterapred Robaxin and diclofenac. Hopefully this will take care of it is not better she'll let us know MRI.  Follow-Up Instructions: Return if symptoms worsen or fail to improve.   Orders:  Orders Placed This Encounter  Procedures  . XR HIP UNILAT W OR W/O PELVIS 2-3 VIEWS RIGHT   Meds ordered this encounter  Medications  . methocarbamol (ROBAXIN) 500 MG tablet    Sig: Take 0.5-1 tablets (250-500 mg total) by mouth every 6 (six) hours as needed for muscle spasms.    Dispense:  30 tablet    Refill:  2  . diclofenac (VOLTAREN) 75 MG EC tablet    Sig: Take 1 tablet (75 mg total) by mouth 2 (two) times daily.    Dispense:  30 tablet    Refill:  2  . predniSONE (STERAPRED UNI-PAK 21 TAB) 10 MG (21) TBPK tablet    Sig: Take as directed    Dispense:  21 tablet    Refill:  0      Procedures: No procedures performed   Clinical Data: No additional findings.   Subjective: Chief Complaint  Patient presents with  . Right Hip - Pain    Patient comes in today with right hip pain radiates down into the foot since about November 2017. She has worsening pain when she sits and drives. She gets numbness when she crosses her legs. It wakes her up at night. She is use Advil and massaging with partial relief. She continues to have pain. She denies any groin pain.    Review of Systems  Constitutional: Negative.   HENT: Negative.   Eyes: Negative.   Respiratory: Negative.   Cardiovascular: Negative.   Endocrine: Negative.   Musculoskeletal: Negative.   Neurological: Negative.   Hematological: Negative.     Psychiatric/Behavioral: Negative.   All other systems reviewed and are negative.    Objective: Vital Signs: There were no vitals taken for this visit.  Physical Exam  Constitutional: She is oriented to person, place, and time. She appears well-developed and well-nourished.  Pulmonary/Chest: Effort normal.  Neurological: She is alert and oriented to person, place, and time.  Skin: Skin is warm. Capillary refill takes less than 2 seconds.  Psychiatric: She has a normal mood and affect. Her behavior is normal. Judgment and thought content normal.  Nursing note and vitals reviewed.   Ortho Exam Family right hip shows painless range of motion. Negative Stinchfield sign negative sciatic tension signs. No tenderness palpation throughout the hip. No focal motor or sensory deficits. Normal reflexes. Specialty Comments:  No specialty comments available.  Imaging: No results found.   PMFS History: Patient Active Problem List   Diagnosis Date Noted  . Vaginal bleeding 12/31/2012  . Chronic anticoagulation 12/31/2012  . Diarrhea 12/31/2012  . Leg edema, left leg (severe) 10/18/2012  . DVT (deep venous thrombosis) (HCC) 10/18/2012  . Postpartum deep vein thrombosis 10/18/2012  . Numbness in left leg 10/18/2012  . History  of atrial tachycardia 10/18/2012  . Breast feeding status of mother 10/18/2012  . Thrombocytopenia (HCC) 10/18/2012  . Fatigue 05/17/2012  . Dyspnea on exertion 05/17/2012  . Tachycardia 07/09/2011   Past Medical History:  Diagnosis Date  . Anxiety   . Exercise tolerance test normal    09-03-2016 no ischemia, low risk study  . Hiatal hernia   . History of abnormal cervical Pap smear    2014  s/p  LEEP  . History of DVT (deep vein thrombosis)    10/ 2013 dx post partum day 18 (following SVD) w/ extensive proximal iliopelvic DVT, left common femoral, profunda, popliteal, posterior tibial, peroneal, and saphenofermoral junction arteries  s/p  transcatheter  thrombolysis w/ angioplasty and stent to left common iliac high grade stenosis  . Inappropriate sinus tachycardia cardiologsit-- dr allred/ dr Lesle Chris--- per dr Johney Frame note longstanding hypersensitive to sinus rhythm   intermittant symptoms since 2002 gestational--- extensive evaluation by dr Macon Large at [facility] in 2003  . 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 Palpitations    intermittant--  . Seasonal allergies     Family History  Problem Relation Age of Onset  . Heart attack Father   . Diabetes Father   . Hypertension Father   . Diabetes Brother   . Heart attack Maternal Grandfather   . Hypertension Brother     Past Surgical History:  Procedure Laterality Date  . EVALUATION UNDER ANESTHESIA WITH ANAL FISTULECTOMY N/A 01/16/2017   Procedure: EXAM UNDER ANESTHESIA WITH POSSIBLE FISTULOTOMY, POSSIBLE SETON;  Surgeon: Romie Levee, MD;  Location: [facility];  Service: General;  Laterality: N/A;  . TRANSCATHETER VENOUS INFUSION THROMBOLYSIS/  MECHANICAL THROMBECTOMY LEFT ILIOFEMORAL OCCLUSIVE THROMBUS/  STENT-ASSISTED BALLOON ANGIOPLASTY HIGH GRADE LEFT COMMON ILIAC VEIN STENOSIS  10/18/2012   findings consistant w/ May-Thurner Syndrome  . TRANSTHORACIC ECHOCARDIOGRAM  05/17/2012   EF 60-65%/  trivial MR  . WISDOM TOOTH EXTRACTION     Social History   Occupational History  . Not on file.   Social History Main Topics  . Smoking status: Never Smoker  . Smokeless tobacco: Never Used  . Alcohol use No  . Drug use: No  . Sexual activity: Not on file

## 2017-03-17 ENCOUNTER — Ambulatory Visit (INDEPENDENT_AMBULATORY_CARE_PROVIDER_SITE_OTHER): Payer: 59

## 2017-03-17 ENCOUNTER — Encounter (INDEPENDENT_AMBULATORY_CARE_PROVIDER_SITE_OTHER): Payer: Self-pay | Admitting: Orthopaedic Surgery

## 2017-03-17 ENCOUNTER — Ambulatory Visit (INDEPENDENT_AMBULATORY_CARE_PROVIDER_SITE_OTHER): Payer: 59 | Admitting: Orthopaedic Surgery

## 2017-03-17 DIAGNOSIS — M545 Low back pain: Secondary | ICD-10-CM

## 2017-03-17 NOTE — Addendum Note (Signed)
Addended by: Albertina ParrGARCIA, Rosene Pilling on: 03/17/2017 10:20 AM   Modules accepted: Orders

## 2017-03-17 NOTE — Progress Notes (Signed)
Office Visit Note   Patient: Susan BlightLisa Michelle Roach           Date of Birth: 1971/03/27           MRN: [id number] Visit Date: 03/17/2017              Requested by: No referring provider defined for this encounter. PCP: [name] Per Patient   Assessment & Plan: Visit Diagnoses:  1. Low back pain, unspecified back pain laterality, unspecified chronicity, with sciatica presence unspecified     Plan: MRI lumbar spine ordered to evaluate for radiculopathy.  Follow-Up Instructions: Return if symptoms worsen or fail to improve.   Orders:  Orders Placed This Encounter  Procedures  . XR Lumbar Spine 2-3 Views   No orders of the defined types were placed in this encounter.     Procedures: No procedures performed   Clinical Data: No additional findings.   Subjective: Chief Complaint  Patient presents with  . Right Hip - Follow-up    Patient comes in with continued right hip pain. She states that it radiates down into the thigh and sometimes past her knee down to the foot. Denies any focal deficits.    Review of Systems   Objective: Vital Signs: There were no vitals taken for this visit.  Physical Exam  Ortho Exam Exam shows no focal findings no motor or sensory deficits. Specialty Comments:  No specialty comments available.  Imaging: No results found.   PMFS History: Patient Active Problem List   Diagnosis Date Noted  . Vaginal bleeding 12/31/2012  . Chronic anticoagulation 12/31/2012  . Diarrhea 12/31/2012  . Leg edema, left leg (severe) 10/18/2012  . DVT (deep venous thrombosis) (HCC) 10/18/2012  . Postpartum deep vein thrombosis 10/18/2012  . Numbness in left leg 10/18/2012  . History of atrial tachycardia 10/18/2012  . Breast feeding status of mother 10/18/2012  . Thrombocytopenia (HCC) 10/18/2012  . Fatigue 05/17/2012  . Dyspnea on exertion 05/17/2012  . Tachycardia 07/09/2011   Past Medical History:  Diagnosis Date  . Anxiety   . Exercise  tolerance test normal    09-03-2016 no ischemia, low risk study  . Hiatal hernia   . History of abnormal cervical Pap smear    2014  s/p  LEEP  . History of DVT (deep vein thrombosis)    10/ 2013 dx post partum day 18 (following SVD) w/ extensive proximal iliopelvic DVT, left common femoral, profunda, popliteal, posterior tibial, peroneal, and saphenofermoral junction arteries  s/p  transcatheter thrombolysis w/ angioplasty and stent to left common iliac high grade stenosis  . Inappropriate sinus tachycardia cardiologsit-- dr allred/ dr Lesle Chriskathyrn thompson--- per dr Johney Frameallred note longstanding hypersensitive to sinus rhythm   intermittant symptoms since 2002 gestational--- [facility]  . 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 Palpitations    intermittant--  . Seasonal allergies     Family History  Problem Relation Age of Onset  . Heart attack Father   . Diabetes Father   . Hypertension Father   . Diabetes Brother   . Heart attack Maternal Grandfather   . Hypertension Brother     Past Surgical History:  Procedure Laterality Date  . EVALUATION UNDER ANESTHESIA WITH ANAL FISTULECTOMY N/A 01/16/2017   Procedure: EXAM UNDER ANESTHESIA WITH POSSIBLE FISTULOTOMY, POSSIBLE SETON;  Surgeon: Romie LeveeAlicia Thomas, MD;  Location: [facility];  Service: General;  Laterality: N/A;  . TRANSCATHETER VENOUS INFUSION THROMBOLYSIS/  MECHANICAL THROMBECTOMY LEFT ILIOFEMORAL OCCLUSIVE THROMBUS/  STENT-ASSISTED BALLOON ANGIOPLASTY HIGH GRADE LEFT COMMON ILIAC VEIN STENOSIS  10/18/2012   findings consistant w/ May-Thurner Syndrome  . TRANSTHORACIC ECHOCARDIOGRAM  05/17/2012   EF 60-65%/  trivial MR  . WISDOM TOOTH EXTRACTION     Social History   Occupational History  . Not on file.   Social History Main Topics  . Smoking  status: Never Smoker  . Smokeless tobacco: Never Used  . Alcohol use No  . Drug use: No  . Sexual activity: Not on file

## 2017-03-25 ENCOUNTER — Other Ambulatory Visit: Payer: 59

## 2017-03-28 ENCOUNTER — Ambulatory Visit
Admission: RE | Admit: 2017-03-28 | Discharge: 2017-03-28 | Disposition: A | Discharge status: Home / Self Care | Payer: 59 | Source: Ambulatory Visit | Attending: Orthopaedic Surgery | Admitting: Orthopaedic Surgery

## 2017-03-28 DIAGNOSIS — M545 Low back pain: Secondary | ICD-10-CM

## 2017-04-07 ENCOUNTER — Other Ambulatory Visit: Payer: 59

## 2017-04-13 ENCOUNTER — Other Ambulatory Visit: Payer: 59

## 2017-06-01 NOTE — Addendum Note (Signed)
Addendum  created 06/01/17 1015 by Karl Erway, MD   Sign clinical note    

## 2017-06-03 ENCOUNTER — Other Ambulatory Visit: Payer: Self-pay | Admitting: Obstetrics and Gynecology

## 2017-06-03 DIAGNOSIS — R928 Other abnormal and inconclusive findings on diagnostic imaging of breast: Secondary | ICD-10-CM

## 2017-06-08 ENCOUNTER — Ambulatory Visit
Admission: RE | Admit: 2017-06-08 | Discharge: 2017-06-08 | Disposition: A | Discharge status: Home / Self Care | Payer: 59 | Source: Ambulatory Visit | Attending: Obstetrics and Gynecology | Admitting: Obstetrics and Gynecology

## 2017-06-08 DIAGNOSIS — R928 Other abnormal and inconclusive findings on diagnostic imaging of breast: Secondary | ICD-10-CM

## 2017-08-24 ENCOUNTER — Other Ambulatory Visit: Payer: Self-pay | Admitting: Physician Assistant

## 2017-12-23 ENCOUNTER — Telehealth (INDEPENDENT_AMBULATORY_CARE_PROVIDER_SITE_OTHER): Payer: Self-pay | Admitting: Orthopaedic Surgery

## 2017-12-23 NOTE — Telephone Encounter (Signed)
Patient returned call regarding appointment with Dr. Roda ShuttersXu. CB # [phone]

## 2017-12-23 NOTE — Telephone Encounter (Signed)
APPT MADE TOMORROW

## 2017-12-24 ENCOUNTER — Ambulatory Visit (INDEPENDENT_AMBULATORY_CARE_PROVIDER_SITE_OTHER): Payer: 59

## 2017-12-24 ENCOUNTER — Encounter (INDEPENDENT_AMBULATORY_CARE_PROVIDER_SITE_OTHER): Payer: Self-pay | Admitting: Orthopaedic Surgery

## 2017-12-24 ENCOUNTER — Ambulatory Visit (INDEPENDENT_AMBULATORY_CARE_PROVIDER_SITE_OTHER): Payer: 59 | Admitting: Orthopaedic Surgery

## 2017-12-24 DIAGNOSIS — M25551 Pain in right hip: Secondary | ICD-10-CM | POA: Diagnosis not present

## 2017-12-24 NOTE — Progress Notes (Signed)
Office Visit Note   Patient: [name]           Date of Birth: 1971-12-02           MRN: [id number] Visit Date: 12/24/2017              Requested by: No referring provider defined for this encounter. PCP: Patient, [name] Per   Assessment & Plan: Visit Diagnoses:  1. Pain in right hip     Plan: Impression is right hip pain possible labral tear versus early degenerative joint disease.  Patient has failed extensive conservative treatment.  MRI of the lumbar spine was negative.  Recommend MR arthrogram of the right hip to rule out structural abnormalities.  I will contact patient regarding the results and treatment plan. Total face to face encounter time was greater than 25 minutes and over half of this time was spent in counseling and/or coordination of care.  Follow-Up Instructions: Return if symptoms worsen or fail to improve.   Orders:  Orders Placed This Encounter  Procedures  . XR HIP UNILAT W OR W/O PELVIS 2-3 VIEWS RIGHT   No orders of the defined types were placed in this encounter.     Procedures: No procedures performed   Clinical Data: No additional findings.   Subjective: Chief Complaint  Patient presents with  . Right Hip - Pain    HPI [name] comes in today for continued right hip pain.  She denies any groin pain.  Her pain is mainly in the ATFL region.  She does report start up stiffness and pain.  She takes Advil.  There is a dull ache.  She denies any radicular symptoms.  She had a previous lumbar spine MRI in March that was completely normal.  Her pain is not correlated with menstrual cycle. Review of Systems  Constitutional: Negative.   HENT: Negative.   Eyes: Negative.   Respiratory: Negative.   Cardiovascular: Negative.   Endocrine: Negative.   Musculoskeletal: Negative.   Neurological: Negative.   Hematological: Negative.   Psychiatric/Behavioral: Negative.   All other systems reviewed and are negative.    Objective: Vital  Signs: There were no vitals taken for this visit.  Physical Exam  Constitutional: She is oriented to person, place, and time. She appears well-developed and well-nourished.  Pulmonary/Chest: Effort normal.  Neurological: She is alert and oriented to person, place, and time.  Skin: Skin is warm. Capillary refill takes less than 2 seconds.  Psychiatric: She has a normal mood and affect. Her behavior is normal. Judgment and thought content normal.  Nursing note and vitals reviewed.   Ortho Exam Right hip exam shows negative Stinchfield sign.  No pain with resisted hip abduction or adduction or circumduction.  Negative straight leg.  Pain with extreme hip flexion and abduction.  Specialty Comments:  No specialty comments available.  Imaging: Xr Hip Unilat W Or W/o Pelvis 2-3 Views Right  Result Date: 12/24/2017 Superior femoral head osteophyte.  Mild joint space narrowing.      PMFS History: Patient Active Problem List   Diagnosis Date Noted  . Vaginal bleeding 12/31/2012  . Chronic anticoagulation 12/31/2012  . Diarrhea 12/31/2012  . Leg edema, left leg (severe) 10/18/2012  . DVT (deep venous thrombosis) (HCC) 10/18/2012  . Postpartum deep vein thrombosis 10/18/2012  . Numbness in left leg 10/18/2012  . History of atrial tachycardia 10/18/2012  . Breast feeding status of mother 10/18/2012  . Thrombocytopenia (HCC) 10/18/2012  . Fatigue 05/17/2012  .  Dyspnea on exertion 05/17/2012  . Tachycardia 07/09/2011   Past Medical History:  Diagnosis Date  . Anxiety   . Exercise tolerance test normal    09-03-2016 no ischemia, low risk study  . Hiatal hernia   . History of abnormal cervical Pap smear    2014  s/p  LEEP  . History of DVT (deep vein thrombosis)    10/ 2013 dx post partum day 18 (following SVD) w/ extensive proximal iliopelvic DVT, left common femoral, profunda, popliteal, posterior tibial, peroneal, and saphenofermoral junction arteries  s/p  transcatheter  thrombolysis w/ angioplasty and stent to left common iliac high grade stenosis  . Inappropriate sinus tachycardia cardiologsit-- dr allred/ dr Lesle Chriskathyrn thompson--- per dr Johney Frameallred note longstanding hypersensitive to sinus rhythm   intermittant symptoms since 2002 gestational--- [facility]  . 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 Palpitations    intermittant--  . Seasonal allergies     Family History  Problem Relation Age of Onset  . Heart attack Father   . Diabetes Father   . Hypertension Father   . Diabetes Brother   . Heart attack Maternal Grandfather   . Hypertension Brother     Past Surgical History:  Procedure Laterality Date  . EVALUATION UNDER ANESTHESIA WITH ANAL FISTULECTOMY N/A 01/16/2017   Procedure: EXAM UNDER ANESTHESIA WITH POSSIBLE FISTULOTOMY, POSSIBLE SETON;  Surgeon: Romie LeveeAlicia Thomas, MD;  [facility];  Service: General;  Laterality: N/A;  . TRANSCATHETER VENOUS INFUSION THROMBOLYSIS/  MECHANICAL THROMBECTOMY LEFT ILIOFEMORAL OCCLUSIVE THROMBUS/  STENT-ASSISTED BALLOON ANGIOPLASTY HIGH GRADE LEFT COMMON ILIAC VEIN STENOSIS  10/18/2012   findings consistant w/ May-Thurner Syndrome  . TRANSTHORACIC ECHOCARDIOGRAM  05/17/2012   EF 60-65%/  trivial MR  . WISDOM TOOTH EXTRACTION     Social History   Occupational History  . Not on file  Tobacco Use  . Smoking status: Never Smoker  . Smokeless tobacco: Never Used  Substance and Sexual Activity  . Alcohol use: No  . Drug use: No  . Sexual activity: Not on file

## 2017-12-30 ENCOUNTER — Other Ambulatory Visit (INDEPENDENT_AMBULATORY_CARE_PROVIDER_SITE_OTHER): Payer: Self-pay | Admitting: Orthopaedic Surgery

## 2017-12-30 DIAGNOSIS — M25551 Pain in right hip: Secondary | ICD-10-CM

## 2018-01-05 ENCOUNTER — Other Ambulatory Visit: Payer: Self-pay | Admitting: Physician Assistant

## 2018-01-07 ENCOUNTER — Ambulatory Visit (HOSPITAL_COMMUNITY): Payer: 59

## 2018-01-07 ENCOUNTER — Other Ambulatory Visit (HOSPITAL_COMMUNITY): Payer: 59

## 2018-01-13 ENCOUNTER — Ambulatory Visit
Admission: RE | Admit: 2018-01-13 | Discharge: 2018-01-13 | Disposition: A | Discharge status: Home / Self Care | Payer: 59 | Source: Ambulatory Visit | Attending: Orthopaedic Surgery | Admitting: Orthopaedic Surgery

## 2018-01-13 ENCOUNTER — Other Ambulatory Visit: Payer: 59

## 2018-01-13 DIAGNOSIS — M25551 Pain in right hip: Secondary | ICD-10-CM

## 2018-01-13 MED ORDER — IOPAMIDOL (ISOVUE-M 200) INJECTION 41%
15.0000 mL | Freq: Once | INTRAMUSCULAR | Status: AC
  Administered 2018-01-13: 15 mL via INTRA_ARTICULAR

## 2018-02-18 ENCOUNTER — Other Ambulatory Visit: Payer: Self-pay | Admitting: Physician Assistant

## 2018-03-06 ENCOUNTER — Other Ambulatory Visit: Payer: Self-pay | Admitting: Physician Assistant

## 2018-04-02 ENCOUNTER — Other Ambulatory Visit: Payer: Self-pay | Admitting: Internal Medicine

## 2018-04-02 MED ORDER — ATENOLOL 25 MG PO TABS: 25.0000 mg | ORAL_TABLET | Freq: Every day | ORAL | 0 refills | Status: DC

## 2018-04-02 NOTE — Telephone Encounter (Signed)
°*  STAT* If patient is at the pharmacy, call can be transferred to refill team.   1. Which medications need to be refilled? (please list name of each medication and dose if known) Atenolol 25 mg   2. Which pharmacy/location (including street and city if local pharmacy) is medication to be sent to?[facility] [place], [place] [phone]  3. Do they need a 30 day or 90 day supply? 90  Patient has an appointment with Dr. Johney FrameAllred 06-09-18 @3 :45pm

## 2018-04-02 NOTE — Telephone Encounter (Signed)
Pt's medication was sent to pt's pharmacy as requested. Confirmation received.  °

## 2018-05-31 ENCOUNTER — Ambulatory Visit: Payer: 59 | Admitting: Internal Medicine

## 2018-05-31 ENCOUNTER — Encounter: Payer: Self-pay | Admitting: *Deleted

## 2018-05-31 VITALS — BP 120/60 | HR 78 | Ht 68.0 in | Wt 188.0 lb

## 2018-05-31 DIAGNOSIS — E785 Hyperlipidemia, unspecified: Secondary | ICD-10-CM

## 2018-05-31 DIAGNOSIS — R000 Tachycardia, unspecified: Secondary | ICD-10-CM | POA: Diagnosis not present

## 2018-05-31 MED ORDER — ATENOLOL 25 MG PO TABS: 25.0000 mg | ORAL_TABLET | Freq: Every day | ORAL | 3 refills | Status: DC

## 2018-05-31 NOTE — Progress Notes (Signed)
PCP: Patient, [name] Per   Primary EP: Dr Dereck Leep Chrishana Spargur is a 47 y.o. female who presents today for routine electrophysiology followup.  Since last being seen in our clinic, the patient reports doing very well.  She is working with a Psychologist, educational and is trying to be more active.  She has SOB with walking up hills and notices increased heart rates then.  She denies symptoms typically however with usual activity.  She had a friend who recent died at age 63 suddenly.  Given her father's early death, this has caused both anxiety and grief for her.  She primarily presents today for refills as she has not seen me in since 2015.  Today, she denies symptoms of palpitations, chest pain,  lower extremity edema, dizziness, presyncope, or syncope.  The patient is otherwise without complaint today.   Past Medical History:  Diagnosis Date  . Anxiety   . Exercise tolerance test normal    09-03-2016 no ischemia, low risk study  . Hiatal hernia   . History of abnormal cervical Pap smear    2014  s/p  LEEP  . History of DVT (deep vein thrombosis)    10/ 2013 dx post partum day 18 (following SVD) w/ extensive proximal iliopelvic DVT, left common femoral, profunda, popliteal, posterior tibial, peroneal, and saphenofermoral junction arteries  s/p  transcatheter thrombolysis w/ angioplasty and stent to left common iliac high grade stenosis  . 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 Seasonal allergies   . Sinus tachycardia    intermittant symptoms since 2002 gestational--- extensive evaluation by dr Macon Large at [facility] in 2003   Past Surgical History:  Procedure Laterality Date  . EVALUATION UNDER ANESTHESIA WITH ANAL FISTULECTOMY N/A 01/16/2017   Procedure: EXAM UNDER ANESTHESIA WITH POSSIBLE FISTULOTOMY, POSSIBLE SETON;  Surgeon: Romie Levee, MD;  Location: [facility];  Service: General;  Laterality: N/A;  . TRANSCATHETER VENOUS INFUSION THROMBOLYSIS/  MECHANICAL THROMBECTOMY LEFT ILIOFEMORAL OCCLUSIVE THROMBUS/  STENT-ASSISTED BALLOON ANGIOPLASTY HIGH GRADE LEFT COMMON ILIAC VEIN STENOSIS  10/18/2012   findings consistant w/ May-Thurner Syndrome  . TRANSTHORACIC ECHOCARDIOGRAM  05/17/2012   EF 60-65%/  trivial MR  . WISDOM TOOTH EXTRACTION      ROS- all systems are reviewed and negatives except as per HPI above  Current Outpatient Medications  Medication Sig Dispense Refill  . ALPRAZolam (XANAX) 0.5 MG tablet Take 0.5 mg by mouth every 8 (eight) hours as needed for anxiety.   0  . Ascorbic Acid (VITAMIN C) 1000 MG tablet Take 1,000 mg by mouth daily.    Marland Kitchen atenolol (TENORMIN) 25 MG tablet Take 1 tablet (25 mg total) by mouth daily. Please keep upcoming appt with Dr. Johney Frame before anymore refills. Thank you 90 tablet 0  . cetirizine (ZYRTEC) 10 MG tablet Take 10 mg by mouth daily.    Marland Kitchen ibuprofen (ADVIL,MOTRIN) 200 MG tablet Take 200 mg by mouth once.    . ranitidine (ZANTAC) 150 MG tablet Take 150 mg by mouth as needed for heartburn.     No current facility-administered medications for this visit.     Physical Exam: Vitals:   05/31/18 1435  BP: 120/60  Pulse: 78  Weight: 188 lb (85.3 kg)  Height: 5\' 8"  (1.727 m)    GEN- The patient is well appearing, alert and oriented x 3 today.  tearful  Head- normocephalic, atraumatic Eyes-  Sclera clear, conjunctiva pink Ears- hearing intact Oropharynx- clear Neck- supple, no bruit Lungs- Clear to ausculation bilaterally, normal work of breathing Heart- Regular rate and rhythm, no murmurs, rubs or gallops, PMI not laterally displaced GI- soft, NT, ND, + BS Extremities- no clubbing, cyanosis, or edema  Wt Readings from Last 3 Encounters:  05/31/18 188 lb (85.3 kg)  01/16/17 173 lb (78.5 kg)  12/31/16 174 lb (78.9 kg)    EKG tracing ordered today is personally reviewed and shows sinus  rhythm 78 bpm, otherwise normal ekg  Assessment and Plan:  1. Sinus tachycardia Stable No changes at this time We discussed lifestyle modification efforts  2. Routine health maintenance Fasting lipids  [facility] were reviewed on her patient portal.  TSH, CMET, CBC were unremarkable  Return to see EP PA annually I will see when needed  Hillis RangeJames Alphons Burgert MD, [facility] 05/31/2018 2:48 PM

## 2018-05-31 NOTE — Patient Instructions (Addendum)
Medication Instructions:  Your physician recommends that you continue on your current medications as directed. Please refer to the Current Medication list given to you today.  Labwork: You will get fasting lipids in [place].  Testing/Procedures: None ordered.  Follow-Up: Your physician wants you to follow-up in: one year with Francis Dowseenee Ursuy, PA.   You will receive a reminder letter in the mail two months in advance. If you don't receive a letter, please call our office to schedule the follow-up appointment.  Any Other Special Instructions Will Be Listed Below (If Applicable).  If you need a refill on your cardiac medications before your next appointment, please call your pharmacy.

## 2018-06-09 ENCOUNTER — Ambulatory Visit: Payer: 59 | Admitting: Internal Medicine

## 2018-06-15 ENCOUNTER — Telehealth: Payer: Self-pay | Admitting: Internal Medicine

## 2018-06-15 NOTE — Telephone Encounter (Signed)
New Message   Pt states she had labs done on 6/3 but was never contacted with the results. Please call

## 2018-06-17 NOTE — Telephone Encounter (Signed)
Returned call to Pt.  Per further review-middle name [facility] file at [facility] office not correct (see copy of driver's license)-given to supervisor to correct.  Also-MRN (patient ID) not included on Pt results so results not on Pt chart.  Call placed to [facility]-adding Pt MRN to results so that they will be on patient chart.  Advised Pt that her results should be available on MyChart shortly.  Will provide further advisement after Dr. Johney FrameAllred able to review.

## 2018-06-18 NOTE — Telephone Encounter (Signed)
Left message advising results had been received. Results sent via MyChart message.

## 2018-06-28 ENCOUNTER — Encounter: Payer: Self-pay | Admitting: Internal Medicine

## 2018-06-29 ENCOUNTER — Telehealth: Payer: Self-pay | Admitting: Internal Medicine

## 2018-06-29 DIAGNOSIS — R000 Tachycardia, unspecified: Secondary | ICD-10-CM

## 2018-06-29 NOTE — Telephone Encounter (Signed)
New Message   Pt calling to check on the e-mail she sent regarding the heart flutters. Please call

## 2018-06-30 NOTE — Telephone Encounter (Signed)
Returned call to Pt.  Advised APP would like to order a holter monitor for Pt at this time to see if any arrhthymias could be identified before making any medication changes.  Pt also has history of anxiety and hiatal hernia.  Holter order entered.    Will follow up based on results of holter.

## 2018-07-05 ENCOUNTER — Ambulatory Visit (INDEPENDENT_AMBULATORY_CARE_PROVIDER_SITE_OTHER): Payer: 59

## 2018-07-05 DIAGNOSIS — R000 Tachycardia, unspecified: Secondary | ICD-10-CM | POA: Diagnosis not present

## 2018-07-12 ENCOUNTER — Encounter: Payer: Self-pay | Admitting: Internal Medicine

## 2018-07-15 ENCOUNTER — Telehealth: Payer: Self-pay | Admitting: Physician Assistant

## 2018-07-15 NOTE — Telephone Encounter (Signed)
Called patient, left a message at her mobile number that I would try her again in the morning, or if she needed to, to call the office left the number.  Called liste work number, Transport plannerautomated message/machine, I did not leave a message at the work number.  Francis Dowseenee Ursuy, PA-C

## 2018-07-16 ENCOUNTER — Telehealth: Payer: Self-pay | Admitting: Physician Assistant

## 2018-07-16 NOTE — Telephone Encounter (Signed)
Led and spoke with the patient.  She reports historically her palpitations were a flutter sensation/fleeting and few/far between.  In the last 3 weeks, it seems after having had a 24  GI bug associated with significant nausea, they have become daily .  They feel different in that it seems to make her want to cough.  She has no CP or SOB, no dizziness, near syncope or syncope.  She exercises very regularly cardio and some training with excellent exertional capacity and no symptoms while exercising.  She is aware of her palpitations on/off through the day but more notable at night when not as active.  I have reviewed her holter tracings.  No arrhythmias.  She has time of ST that correlates with the time of day she was exercising.  Most of the patient triggered recordings are associated with  Single PAC or PJC.  Some are NSR with no ectopy.  Discussed changing her BB though historically when this has been tried she felt worse.   I tried to assure the patient that her holter had no worrisome findings, and discussed making sure she is keeping adequately hydrated, avoid caffeine, and try to identify any new things such as OCT medicines, supplements that may be new or contain caffeine, or food/trigger potentially.   She is now having her menses and is not pregnant.  She is quite concerned about her heart health and mentions her father's death, that she tends to be very worried and acutely aware of any heart symptom.  She did take her xanax for a couple days though this did not seem to make a difference.  I have messaged to have her scheduled to see me.

## 2018-07-16 NOTE — Telephone Encounter (Signed)
F/U      Patient returns [name] call, pls call again

## 2018-07-21 NOTE — Progress Notes (Signed)
Cardiology Office Note Date:  07/22/2018  Patient ID:  Susan, Roach 1971/04/15, MRN [id number] PCP:  Patient, [name] Per  Cardiologist/electrophysiologist: dr. Johney Frame    Chief Complaint: ongoing palpitations  History of Present Illness: [name] is a 47 y.o. 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  She comes in today to be seen for Dr. Johney Frame.  He last saw her in June this year, at that time doing well, he had not seen her since 2015 prior to that.with persistent c/o palpitations.  She was working out regularly, noted some SOB with walking hills with faster HR.  No new recommendations, planned for annual follow-ups with APP, and him as needed.  Since that visit she developed palpitations without other symptoms and via communication with RN, recommended she wear a monitor.  Via telephone conversation with her, she mentioned no other symptoms, no CP, no SOB, no dizziness, near syncope or syncope.  She was exercising very regularly with excellent exertional capacity and essentially feeling quite well, outside of the palpitations.  They themselves she stated did not make her feel bad either, though occassionally associated with a reflexive cough with them, just creating a very acute awareness of her heart that is anxiety provoking for her.  Her holter was reviewed and the ST noted correlated with the time she would have been working out, most of her symptom tracings correlated with single PAC/PJC's, some did not note any ectopy.  She comes in today with the same palpitations as discussed over the phone.  She gets teary eyed in discussing them and worries that they are a sign of something more serious given her hx (she explains that she had very vague symptoms back then that ended up being a DVT) and her father's  sudden death.  She has not had any LE trauma or immobilization, she drove to [employer] some weeks ago but given her hx, they stop often and she gets up and walks often, she is not pregnant, her last pregnancy years ago, no OBC or IUD or hormone replacement, she is very active and without any SOB, or exertional intolerances.  No CP.   Past Medical History:  Diagnosis Date  . Anxiety   . Exercise tolerance test normal    09-03-2016 no ischemia, low risk study  . Hiatal hernia   . History of abnormal cervical Pap smear    2014  s/p  LEEP  . History of DVT (deep vein thrombosis)    10/ 2013 dx post partum day 18 (following SVD) w/ extensive proximal iliopelvic DVT, left common femoral, profunda, popliteal, posterior tibial, peroneal, and saphenofermoral junction arteries  s/p  transcatheter thrombolysis w/ angioplasty and stent to left common iliac high grade stenosis  . 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 Seasonal allergies   . Sinus tachycardia    intermittant symptoms since 2002 gestational--- extensive evaluation by dr Macon Large at [facility] in 2003    Past Surgical History:  Procedure Laterality Date  . EVALUATION UNDER ANESTHESIA WITH ANAL FISTULECTOMY N/A 01/16/2017   Procedure: EXAM UNDER ANESTHESIA WITH POSSIBLE FISTULOTOMY, POSSIBLE SETON;  Surgeon: Romie Levee, MD;  [facility];  Service: General;  Laterality: N/A;  . TRANSCATHETER VENOUS INFUSION  THROMBOLYSIS/  MECHANICAL THROMBECTOMY LEFT ILIOFEMORAL OCCLUSIVE THROMBUS/  STENT-ASSISTED BALLOON ANGIOPLASTY HIGH GRADE LEFT COMMON ILIAC VEIN STENOSIS  10/18/2012   findings consistant w/ May-Thurner Syndrome  . TRANSTHORACIC ECHOCARDIOGRAM  05/17/2012   EF 60-65%/  trivial MR  . WISDOM TOOTH EXTRACTION      Current Outpatient Medications  Medication Sig Dispense  Refill  . ALPRAZolam (XANAX) 0.5 MG tablet Take 0.5 mg by mouth every 8 (eight) hours as needed for anxiety.   0  . Ascorbic Acid (VITAMIN C) 1000 MG tablet Take 1,000 mg by mouth daily.    Marland Kitchen. atenolol (TENORMIN) 25 MG tablet Take 1 tablet (25 mg total) by mouth daily. 90 tablet 3  . cetirizine (ZYRTEC) 10 MG tablet Take 10 mg by mouth daily.    . diphenhydrAMINE (BENADRYL) 25 mg capsule Take by mouth.    Marland Kitchen. ibuprofen (ADVIL,MOTRIN) 200 MG tablet Take 200 mg by mouth once.    . ranitidine (ZANTAC) 150 MG tablet Take 150 mg by mouth as needed for heartburn.     No current facility-administered medications for this visit.     Allergies:   Azithromycin; Cheese; Metoprolol; Moxifloxacin; Avelox [moxifloxacin hcl in nacl]; and Moxifloxacin hcl   Social History:  The patient  reports that she has never smoked. She has never used smokeless tobacco. She reports that she does not drink alcohol or use drugs.   Family History:  The patient's family history includes Diabetes in her father; Diabetes type II in her brother; Healthy in her son, son, and son; Heart attack in her father and maternal grandfather; Hypertension in her brother and father.  ROS:  Please see the history of present illness.   All other systems are reviewed and otherwise negative.   PHYSICAL EXAM:  VS:  BP 118/82   Pulse 65   Ht 5\' 8"  (1.727 m)   Wt 183 lb 3.2 oz (83.1 kg)   SpO2 98%   BMI 27.86 kg/m  BMI: Body mass index is 27.86 kg/m. Well nourished, well developed, in no acute distress  HEENT: normocephalic, atraumatic  Neck: no JVD, carotid bruits or masses Cardiac:  RRR; no significant murmurs, no rubs, or gallops Lungs:  CTA b/l, no wheezing, rhonchi or rales  Abd: soft, nontender MS: no deformity or atrophy Ext: no edema, palpable mass, no skin changes or calf tenderness Skin: warm and dry, no rash Neuro:  No gross deficits appreciated Psych: euthymic mood, full affect   EKG:  Done today shows SR, 65bpm,  normal EKG  07/05/18: 48 hour monitor 1. NSR with sinus brady and sinus tachy 2. occaisional PAC's and PJC's 3. No PVC's, VT or SVT 4. No significant pauses  09/03/16; ETT  Blood pressure demonstrated a blunted response to exercise.  There was no ST segment deviation noted during stress.  No ischemia noted at an excellent workload of 11.7 mets with normal HR response.  Low risk study.  12/11/16: LE venous US Summary: - No evidence of deep vein or superficial thrombosis involving the right lower extremity and left common femoral vein. - No evidence of Baker&'s cyst on the right  10/04/14: LE venous US Summary: - No evidence of deep vein or superficial thrombosis involving the right lower extremity and left lower extremity. - No evidence of Baker&'s cyst on the right or left.  05/17/12: TTE Study Conclusions Left ventricle: The cavity size was normal. Wall thickness was normal. Systolic function was normal. The estimated ejection fraction was in the range of  60% to 65%. Wall motion was normal; there were no regional wall motion abnormalities  Recent Labs: 07/22/2018: BUN 12; Creatinine, Ser 1.01; Hemoglobin 15.4; Platelets 175; Potassium 4.7; Sodium 140  No results found for requested labs within last 8760 hours.   Estimated Creatinine Clearance: 80.3 mL/min (A) (by C-G formula based on SCr of 1.01 mg/dL (H)).   Wt Readings from Last 3 Encounters:  07/22/18 183 lb 3.2 oz (83.1 kg)  05/31/18 188 lb (85.3 kg)  01/16/17 173 lb (78.5 kg)     Other studies reviewed: Additional studies/records reviewed today include: summarized above  ASSESSMENT AND PLAN:  1. Palpitations 2. Single PAC/PJCs only     These are very anxiety provoking for her  Given he hx (DVT) and significant anxiety/concern the patient has I reviewed the case with DOD (Dr. Eldridge Dace).  She has no abnormal exam findings, no resting tachycardia, no SOB, and has a normal EKG, with no provoking event,  nothing to suspect DVT or PE.  Recommends echo, consideration of d-dimer, routine labs.    Disposition: Will get an echo, BMET/CBC, D-dimer, RTC in 2-3 months otherwise, sooner if needed.  Current medicines are reviewed at length with the patient today.  The patient did not have any concerns regarding medicines.  Norma Fredrickson, PA-C 07/22/2018 4:22 PM     [facility] [address] [place] [address] [phone] (office)  [phone] (fax)

## 2018-07-22 ENCOUNTER — Ambulatory Visit: Payer: 59 | Admitting: Physician Assistant

## 2018-07-22 ENCOUNTER — Encounter: Payer: Self-pay | Admitting: Physician Assistant

## 2018-07-22 VITALS — BP 118/82 | HR 65 | Ht 68.0 in | Wt 183.2 lb

## 2018-07-22 DIAGNOSIS — R002 Palpitations: Secondary | ICD-10-CM | POA: Diagnosis not present

## 2018-07-22 DIAGNOSIS — R000 Tachycardia, unspecified: Secondary | ICD-10-CM | POA: Diagnosis not present

## 2018-07-22 DIAGNOSIS — I491 Atrial premature depolarization: Secondary | ICD-10-CM | POA: Diagnosis not present

## 2018-07-22 NOTE — Patient Instructions (Addendum)
Medication Instructions:  Your physician recommends that you continue on your current medications as directed. Please refer to the Current Medication list given to you today.  * If you need a refill on your cardiac medications before your next appointment, please call your pharmacy.   Labwork: Today: BMET, CBC & DDimer *We will only notify you of abnormal results, otherwise continue current treatment plan.  Testing/Procedures: Your physician has requested that you have an echocardiogram. Echocardiography is a painless test that uses sound waves to create images of your heart. It provides your doctor with information about the size and shape of your heart and how well your heart's chambers and valves are working. This procedure takes approximately one hour. There are no restrictions for this procedure.   Follow-Up: Your physician recommends that you schedule a follow-up appointment in: 2 months with Francis Dowseenee Ursuy, PA.   *Please note that any paperwork needing to be filled out by the provider will need to be addressed at the front desk prior to seeing the provider. Please note that any FMLA, disability or other documents regarding health condition is subject to a $25.00 charge that must be received prior to completion of paperwork in the form of a money order or check.  Thank you for choosing [facility]!!   Dory HornSherri Shivaun Bilello, RN [phone]  Any Other Special Instructions Will Be Listed Below (If Applicable).  Echocardiogram An echocardiogram, or echocardiography, uses sound waves (ultrasound) to produce an image of your heart. The echocardiogram is simple, painless, obtained within a short period of time, and offers valuable information to your health care provider. The images from an echocardiogram can provide information such as:  Evidence of coronary artery disease (CAD).  Heart size.  Heart muscle function.  Heart valve function.  Aneurysm detection.  Evidence of a past  heart attack.  Fluid buildup around the heart.  Heart muscle thickening.  Assess heart valve function.  Tell a health care provider about:  Any allergies you have.  All medicines you are taking, including vitamins, herbs, eye drops, creams, and over-the-counter medicines.  Any problems you or family members have had with anesthetic medicines.  Any blood disorders you have.  Any surgeries you have had.  Any medical conditions you have.  Whether you are pregnant or may be pregnant. What happens before the procedure? No special preparation is needed. Eat and drink normally. What happens during the procedure?  In order to produce an image of your heart, gel will be applied to your chest and a wand-like tool (transducer) will be moved over your chest. The gel will help transmit the sound waves from the transducer. The sound waves will harmlessly bounce off your heart to allow the heart images to be captured in real-time motion. These images will then be recorded.  You may need an IV to receive a medicine that improves the quality of the pictures. What happens after the procedure? You may return to your normal schedule including diet, activities, and medicines, unless your health care provider tells you otherwise. This information is not intended to replace advice given to you by your health care provider. Make sure you discuss any questions you have with your health care provider. Document Released: 12/12/2000 Document Revised: 08/02/2016 Document Reviewed: 08/22/2013 Elsevier Interactive Patient Education  2017 [employer]

## 2018-07-23 ENCOUNTER — Encounter: Payer: Self-pay | Admitting: Physician Assistant

## 2018-07-23 LAB — BASIC METABOLIC PANEL
BUN / CREAT RATIO: 12 (ref 9–23)
BUN: 12 mg/dL (ref 6–24)
CALCIUM: 9.2 mg/dL (ref 8.7–10.2)
CHLORIDE: 104 mmol/L (ref 96–106)
CO2: 23 mmol/L (ref 20–29)
CREATININE: 1.01 mg/dL — AB (ref 0.57–1.00)
GFR, EST AFRICAN AMERICAN: 78 mL/min/{1.73_m2} (ref >59)
GFR, EST NON AFRICAN AMERICAN: 68 mL/min/{1.73_m2} (ref >59)
Glucose: 83 mg/dL (ref 65–99)
Potassium: 4.7 mmol/L (ref 3.5–5.2)
Sodium: 140 mmol/L (ref 134–144)

## 2018-07-23 LAB — CBC
HEMATOCRIT: 44 % (ref 34.0–46.6)
Hemoglobin: 15.4 g/dL (ref 11.1–15.9)
MCH: 30.9 pg (ref 26.6–33.0)
MCHC: 35 g/dL (ref 31.5–35.7)
MCV: 88 fL (ref 79–97)
Platelets: 175 10*3/uL (ref 150–450)
RBC: 4.98 x10E6/uL (ref 3.77–5.28)
RDW: 13.7 % (ref 12.3–15.4)
WBC: 5 10*3/uL (ref 3.4–10.8)

## 2018-07-23 LAB — D-DIMER, QUANTITATIVE: D-DIMER: 0.48 mg/L FEU (ref 0.00–0.49)

## 2018-07-23 NOTE — Telephone Encounter (Signed)
I was asked to call the patient. She had bloodwork done that included d-dimer. This resulted at the upper limit of normal (0.48, with cutoff of 0.49). She denies any dyspnea or change in her chest discomfort or LE edema/redness.   I asked her to continue to monitor her symptoms and let us know if anything changes.

## 2018-07-24 ENCOUNTER — Telehealth: Payer: Self-pay | Admitting: Physician Assistant

## 2018-07-24 NOTE — Telephone Encounter (Signed)
Called patient to give her test results.  She had already viewed them and was very worried that her Ddimer was so close to the highest possible normal result, called in last night and discussed with the on-call MD.  Discussed Ddimer though on high end of wnl is still normal, and given no trigger, symptoms or exam findings to suspect DVT and that her result is still within normal acceptable levels, and this was even more reassuring.    Francis Dowseenee Ursuy, PA-C

## 2018-07-27 NOTE — Addendum Note (Signed)
Addended by: Adele SchilderISHER, Kosisochukwu Goldberg M on: 07/27/2018 08:36 AM   Modules accepted: Orders

## 2018-07-29 ENCOUNTER — Encounter: Payer: Self-pay | Admitting: *Deleted

## 2018-07-30 NOTE — Addendum Note (Signed)
Addended by: Dennis BastLANIER, Thomasina Housley F on: 07/30/2018 08:16 AM   Modules accepted: Orders

## 2018-08-02 ENCOUNTER — Ambulatory Visit (HOSPITAL_COMMUNITY): Payer: 59 | Attending: Cardiology

## 2018-08-02 ENCOUNTER — Other Ambulatory Visit: Payer: Self-pay

## 2018-08-02 DIAGNOSIS — R002 Palpitations: Secondary | ICD-10-CM

## 2018-08-02 DIAGNOSIS — R000 Tachycardia, unspecified: Secondary | ICD-10-CM | POA: Insufficient documentation

## 2018-08-21 ENCOUNTER — Other Ambulatory Visit: Payer: Self-pay | Admitting: Internal Medicine

## 2018-08-24 ENCOUNTER — Encounter: Payer: Self-pay | Admitting: Physician Assistant

## 2018-09-07 NOTE — Progress Notes (Deleted)
Cardiology Office Note Date:  09/07/2018  Patient ID:  [name], [name] 01-24-1971, MRN [id number] PCP:  Patient, [name] Per  Cardiologist/electrophysiologist: Dr. Johney Frame    Chief Complaint: planned f/u  History of Present Illness: [name] is a 47 y.o. 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  She comes in today to be seen for Dr. Johney Frame.  He last saw her in June this year, at that time doing well, he had not seen her since 2015 prior to that.with persistent c/o palpitations.  She was working out regularly, noted some SOB with walking hills with faster HR.  No new recommendations, planned for annual follow-ups with APP, and him as needed.  Since that visit she developed palpitations without other symptoms and via communication with RN, recommended she wear a monitor.  Via telephone conversation with her, she mentioned no other symptoms, no CP, no SOB, no dizziness, near syncope or syncope.  She was exercising very regularly with excellent exertional capacity and essentially feeling quite well, outside of the palpitations.  They themselves she stated did not make her feel bad either, though occassionally associated with a reflexive cough with them, just creating a very acute awareness of her heart that is anxiety provoking for her.  Her holter was reviewed and the ST noted correlated with the time she would have been working out, most of her symptom tracings correlated with single PAC/PJC's, some did not note any ectopy.  I saw her in July 2019, she had ongoing palpitations as discussed over the phone.  She became emotional/teary eyed in discussing them and worries that they are a sign of something more serious given her hx (she explains that she had very vague symptoms back then that ended up being a DVT) and her  father's hx of sudden death.  She denied any LE trauma or immobilization, she drove to [employer] some weeks ago but given her hx, they stop often and she gets up and walks often, she denied possibility of pregnancy, her last pregnancy years ago, no OBC or IUD or hormone replacement, she is very active and without any SOB, or exertional intolerances.  No CP.  Given how anxiety provoking her symptoms were, decided to pursue TTE that noted normal LVEF, no WMA, no findings to suggest elevated R heart pressures.  BMET, CBC looked OK, Ddimer was wnl  *** symptoms *** [place] trip   Past Medical History:  Diagnosis Date  . Anxiety   . Exercise tolerance test normal    09-03-2016 no ischemia, low risk study  . Hiatal hernia   . History of abnormal cervical Pap smear    2014  s/p  LEEP  . History of DVT (deep vein thrombosis)    10/ 2013 dx post partum day 18 (following SVD) w/ extensive proximal iliopelvic DVT, left common femoral, profunda, popliteal, posterior tibial, peroneal, and saphenofermoral junction arteries  s/p  transcatheter thrombolysis w/ angioplasty and stent to left common iliac high grade stenosis  . 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 Seasonal allergies   . Sinus tachycardia    intermittant symptoms since 2002 gestational--- extensive evaluation by dr Macon Large at [facility] in 2003    Past Surgical History:  Procedure Laterality Date  .  EVALUATION UNDER ANESTHESIA WITH ANAL FISTULECTOMY N/A 01/16/2017   Procedure: EXAM UNDER ANESTHESIA WITH POSSIBLE FISTULOTOMY, POSSIBLE SETON;  Surgeon: Romie Levee, MD;  Location: [facility];  Service: General;  Laterality: N/A;  . TRANSCATHETER VENOUS INFUSION THROMBOLYSIS/  MECHANICAL THROMBECTOMY LEFT ILIOFEMORAL OCCLUSIVE THROMBUS/  STENT-ASSISTED BALLOON ANGIOPLASTY HIGH GRADE LEFT  COMMON ILIAC VEIN STENOSIS  10/18/2012   findings consistant w/ May-Thurner Syndrome  . TRANSTHORACIC ECHOCARDIOGRAM  05/17/2012   EF 60-65%/  trivial MR  . WISDOM TOOTH EXTRACTION      Current Outpatient Medications  Medication Sig Dispense Refill  . ALPRAZolam (XANAX) 0.5 MG tablet Take 0.5 mg by mouth every 8 (eight) hours as needed for anxiety.   0  . Ascorbic Acid (VITAMIN C) 1000 MG tablet Take 1,000 mg by mouth daily.    Marland Kitchen atenolol (TENORMIN) 25 MG tablet Take 1 tablet (25 mg total) by mouth daily. 90 tablet 3  . cetirizine (ZYRTEC) 10 MG tablet Take 10 mg by mouth daily.    . diphenhydrAMINE (BENADRYL) 25 mg capsule Take by mouth.    Marland Kitchen ibuprofen (ADVIL,MOTRIN) 200 MG tablet Take 200 mg by mouth once.    . ranitidine (ZANTAC) 150 MG tablet Take 150 mg by mouth as needed for heartburn.     No current facility-administered medications for this visit.     Allergies:   Azithromycin; Cheese; Metoprolol; Moxifloxacin; Avelox [moxifloxacin hcl in nacl]; and Moxifloxacin hcl   Social History:  The patient  reports that she has never smoked. She has never used smokeless tobacco. She reports that she does not drink alcohol or use drugs.   Family History:  The patient's family history includes Diabetes in her father; Diabetes type II in her brother; Healthy in her son, son, and son; Heart attack in her father and maternal grandfather; Hypertension in her brother and father.  ROS:  Please see the history of present illness.   All other systems are reviewed and otherwise negative.   PHYSICAL EXAM:  VS:  There were no vitals taken for this visit. BMI: There is no height or weight on file to calculate BMI. Well nourished, well developed, in no acute distress  HEENT: normocephalic, atraumatic  Neck: no JVD, carotid bruits or masses Cardiac:  *** RRR; no significant murmurs, no rubs, or gallops Lungs:  *** CTA b/l, no wheezing, rhonchi or rales  Abd: soft, nontender MS: no deformity or  atrophy Ext: *** no edema, palpable mass, no skin changes or calf tenderness Skin: warm and dry, no rash Neuro:  No gross deficits appreciated Psych: euthymic mood, full affect   EKG:  Not done today  08/02/18: TTE Study Conclusions - Left ventricle: The cavity size was normal. Wall thickness was   normal. Systolic function was normal. The estimated ejection   fraction was in the range of 55% to 60%. Wall motion was normal;   there were no regional wall motion abnormalities. Left   ventricular diastolic function parameters were normal. Impressions: - Normal LV systolic and diastolic function.  07/05/18: 48 hour monitor 1. NSR with sinus brady and sinus tachy 2. occaisional PAC's and PJC's 3. No PVC's, VT or SVT 4. No significant pauses  09/03/16; ETT  Blood pressure demonstrated a blunted response to exercise.  There was no ST segment deviation noted during stress.  No ischemia noted at an excellent workload of 11.7 mets with normal HR response.  Low risk study.  12/11/16: LE venous US Summary: - No evidence of  deep vein or superficial thrombosis involving the right lower extremity and left common femoral vein. - No evidence of Baker&'s cyst on the right  10/04/14: LE venous US Summary: - No evidence of deep vein or superficial thrombosis involving the right lower extremity and left lower extremity. - No evidence of Baker&'s cyst on the right or left.  05/17/12: TTE Study Conclusions Left ventricle: The cavity size was normal. Wall thickness was normal. Systolic function was normal. The estimated ejection fraction was in the range of 60% to 65%. Wall motion was normal; there were no regional wall motion abnormalities  Recent Labs: 07/22/2018: BUN 12; Creatinine, Ser 1.01; Hemoglobin 15.4; Platelets 175; Potassium 4.7; Sodium 140  No results found for requested labs within last 8760 hours.   CrCl cannot be calculated (Patient's most recent lab result is older  than the maximum 21 days allowed.).   Wt Readings from Last 3 Encounters:  07/22/18 183 lb 3.2 oz (83.1 kg)  05/31/18 188 lb (85.3 kg)  01/16/17 173 lb (78.5 kg)     Other studies reviewed: Additional studies/records reviewed today include: summarized above  ASSESSMENT AND PLAN:  1. Palpitations 2. Single PAC/PJCs only     These are very anxiety provoking for her     ***    Disposition: ***  Current medicines are reviewed at length with the patient today.  The patient did not have any concerns regarding medicines.  Norma Fredrickson, PA-C 09/07/2018 8:32 AM     [facility] [address] [place] [address] [phone] (office)  [phone] (fax)

## 2018-09-14 ENCOUNTER — Ambulatory Visit: Payer: 59 | Admitting: Physician Assistant

## 2018-10-20 ENCOUNTER — Ambulatory Visit: Payer: 59 | Admitting: Internal Medicine

## 2018-11-01 ENCOUNTER — Ambulatory Visit (INDEPENDENT_AMBULATORY_CARE_PROVIDER_SITE_OTHER): Payer: 59 | Admitting: Internal Medicine

## 2018-11-01 ENCOUNTER — Telehealth: Payer: Self-pay | Admitting: Emergency Medicine

## 2018-11-01 ENCOUNTER — Other Ambulatory Visit: Payer: Self-pay

## 2018-11-01 ENCOUNTER — Telehealth: Payer: Self-pay | Admitting: *Deleted

## 2018-11-01 ENCOUNTER — Encounter: Payer: Self-pay | Admitting: Internal Medicine

## 2018-11-01 VITALS — BP 129/68 | HR 69 | Temp 98.0°F | Ht 68.0 in | Wt 181.3 lb

## 2018-11-01 DIAGNOSIS — Z86718 Personal history of other venous thrombosis and embolism: Secondary | ICD-10-CM

## 2018-11-01 DIAGNOSIS — Z95820 Peripheral vascular angioplasty status with implants and grafts: Secondary | ICD-10-CM | POA: Diagnosis not present

## 2018-11-01 DIAGNOSIS — M79662 Pain in left lower leg: Secondary | ICD-10-CM | POA: Diagnosis not present

## 2018-11-01 DIAGNOSIS — M79605 Pain in left leg: Secondary | ICD-10-CM

## 2018-11-01 LAB — D-DIMER, QUANTITATIVE (NOT AT ARMC): D DIMER QUANT: 1 ug{FEU}/mL — AB (ref 0.00–0.50)

## 2018-11-01 NOTE — Patient Instructions (Signed)
Thank you for coming to the clinic today. It was a pleasure to see you.   For your symptoms similar to DVT, I will call you with the results of the blood test and ultrasound when they have resulted.   FOLLOW-UP INSTRUCTIONS When: 3-6 months with your primary care provider  For: follow up of your general health  What to bring: all of your medication bottles   Please call the internal medicine center clinic if you have any questions or concerns, we may be able to help and keep you from a long and expensive emergency room wait. Our clinic and after hours phone number is [phone], the best time to call is Monday through Friday 9 am to 4 pm but there is always someone available 24/7 if you have an emergency. If you need medication refills please notify your pharmacy one week in advance and they will send [place] a request.

## 2018-11-01 NOTE — Telephone Encounter (Signed)
Spoke w/ dr Midwife, am to offer pt appt in [facility] per dr Midwife. Called pt, offered appt, explained dr Cyndie Chime is away and instead of ED route, will add to [facility], she is agreeable. This is per dr Midwife.  Pt will come to [facility] at 1515

## 2018-11-01 NOTE — Assessment & Plan Note (Addendum)
Pain in left posterior leg and calf began about four days ago. She has not noticed swelling or erythema in the leg. She had no inciting event or injury. Has a history of left lower extremity DVT in the past with May Thurner syndrome. She has a left common iliac vein stent placed, following stent placement she was on xarelto but she developed uncontrolled vaginal bleeding and was switched to coumadin for 6 more months. She describes having genetic testing for clotting disorder then being told that she no longer needed to take the blood thinner. The symptoms that she has now are very similar to what she experienced in the past. She had URI symptoms and was on antibiotics for two weeks, she completed the course 10 days ago. Of note she traveled to [place] by car one week ago. Pain along the deep venous system and history of prior DVT put her at moderate risk for DVT. She has a wells score of 2. D- dimer was elevated at 1.0 and POC [place] in the office showed normal compressibility of the left leg proximal venous system, distal veins were difficult to find on point of care ultrasound. Ultrasound was scheduled at [facility] for the first appointment tomorrow at 9 am.   Addendum: I called and discussed the results of the negative lower extremity ultrasound with Ms. Mandella. I spoke with Dr. Cyndie Chime about this cased and we discussed our concern for her positive D-dimer and exam features consistent with DVT. There is evidence in the literature that a positive D-dimer has been found to be an independent risk factor associated with higher risk of clotting. We will have Ms. Laveda Norman repeat a ultrasound in one week if her symptoms have not resolved.

## 2018-11-01 NOTE — Telephone Encounter (Signed)
Dr Reece Agar has not documented anything since 2015. pls offer her an ACC appt today rather than referring her to UC/ ED.

## 2018-11-01 NOTE — Progress Notes (Addendum)
XW:RUEA calf pain   HPI:  [name] is a 47 y.o. with PMH as listed below who presents for left leg pain . Please see the assessment and plans for the status of the patient chronic medical problems.   Past Medical History:  Diagnosis Date  . Anxiety   . Exercise tolerance test normal    09-03-2016 no ischemia, low risk study  . Hiatal hernia   . History of abnormal cervical Pap smear    2014  s/p  LEEP  . History of DVT (deep vein thrombosis)    10/ 2013 dx post partum day 18 (following SVD) w/ extensive proximal iliopelvic DVT, left common femoral, profunda, popliteal, posterior tibial, peroneal, and saphenofermoral junction arteries  s/p  transcatheter thrombolysis w/ angioplasty and stent to left common iliac high grade stenosis  . 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 Seasonal allergies   . Sinus tachycardia    intermittant symptoms since 2002 gestational--- extensive evaluation by dr Macon Large at [facility] in 2003   Review of Systems: Refer to history of present illness and assessment and plans for pertinent review of systems, all others reviewed and negative  Family History  Problem Relation Age of Onset  . Diabetes type II Brother   . Hypertension Brother   . Heart attack Father   . Diabetes Father   . Hypertension Father   . Heart attack Maternal Grandfather   . Healthy Son   . Healthy Son   . Healthy Son    Past Surgical History:  Procedure Laterality Date  . EVALUATION UNDER ANESTHESIA WITH ANAL FISTULECTOMY N/A 01/16/2017   Procedure: EXAM UNDER ANESTHESIA WITH POSSIBLE FISTULOTOMY, POSSIBLE SETON;  Surgeon: Romie Levee, MD;  Location: [facility] ;  Service: General;  Laterality: N/A;  . TRANSCATHETER VENOUS INFUSION THROMBOLYSIS/  MECHANICAL THROMBECTOMY LEFT ILIOFEMORAL OCCLUSIVE THROMBUS/  STENT-ASSISTED  BALLOON ANGIOPLASTY HIGH GRADE LEFT COMMON ILIAC VEIN STENOSIS  10/18/2012   findings consistant w/ May-Thurner Syndrome  . TRANSTHORACIC ECHOCARDIOGRAM  05/17/2012   EF 60-65%/  trivial MR  . WISDOM TOOTH EXTRACTION      Social History   Socioeconomic History  . Marital status: Married    Spouse name: Not on file  . Number of children: Not on file  . Years of education: Not on file  . Highest education level: Not on file  Occupational History  . Not on file  Social Needs  . Financial resource strain: Not on file  . Food insecurity:    Worry: Not on file    Inability: Not on file  . Transportation needs:    Medical: Not on file    Non-medical: Not on file  Tobacco Use  . Smoking status: Never Smoker  . Smokeless tobacco: Never Used  Substance and Sexual Activity  . Alcohol use: No  . Drug use: No  . Sexual activity: Not on file  Lifestyle  . Physical activity:    Days per week: Not on file    Minutes per session: Not on file  . Stress: Not on file  Relationships  . Social connections:    Talks on phone: Not on file    Gets together: Not on file    Attends religious service: Not on file    Active member of club or organization: Not on file    Attends meetings of clubs  or organizations: Not on file    Relationship status: Not on file  . Intimate partner violence:    Fear of current or ex partner: Not on file    Emotionally abused: Not on file    Physically abused: Not on file    Forced sexual activity: Not on file  Other Topics Concern  . 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   Physical Exam:  Vitals:   11/01/18 1440  BP: 129/68  Pulse: 69  Temp: 98 F (36.7 C)  TempSrc: Oral  SpO2: 100%  Weight: 181 lb 4.8 oz (82.2 kg)  Height: 5\' 8"  (1.727 m)   General: well appearing, no acute distress  HENT: normocephalic atraumatic, moist mucous membranes  Eyes: normal conjunctiva, non icteric sclera  Cardiac: regular  rate and rhythm, no murmur rubs or gallops, no peripheral edema, left calf 1 cm larger than right, left calf Homans +  Pulm: normal work of breathing, lungs clear to auscultation  Abdomen: non distended  Neuro: awake, alert, oriented to person, time, place, EOMI  Psych: normal mood and affect   Assessment & Plan:   Left calf pain  Pain in left posterior leg and calf began about four days ago. She has not noticed swelling or erythema in the leg. She had no inciting event or injury. Has a history of left lower extremity DVT in the past with May Thurner syndrome in 2015. She has a left common iliac vein stent placed, following stent placement she was on xarelto but she developed uncontrolled vaginal bleeding and was switched to coumadin for 6 more months. She describes having genetic testing for clotting disorder and providers coming to the conclusion that she no longer needed to be on anticoagulation. The symptoms that she has now are very similar to what she experienced in the past. She had URI symptoms and was on antibiotics for two weeks, she completed the course 10 days ago. Of note she traveled to [place] by car one week ago. Pain along the deep venous system and history of prior DVT put her at moderate risk for DVT. She has a wells score of 2. D- dimer was elevated at 1.0 and POC [place] in the office showed normal compressibility of the left leg proximal venous system, distal veins were difficult to find on point of care ultrasound. Ultrasound was scheduled at [facility] for the first appointment tomorrow at 9 am.   Addendum: I called and discussed the results of the negative lower extremity ultrasound with [name]. I spoke with Dr. Cyndie Chime about this cased and we discussed our concern for her positive D-dimer and exam features consistent with DVT. There is evidence in the literature that a positive D-dimer has been found to be an independent risk factor associated with higher risk of  clotting. We will have [name] repeat a ultrasound in one week if her symptoms have not resolved.   Addendum 11/13: I called to follow up with [name] today, she notes that her leg pain has not progressed, she she had a sports massage and noticed this helped to improve the leg pain. She notes no accompanying swelling or erythema of the leg. I explained that we would still like to reevaluate the D-dimer. She is open to returning to [place] but is ask if there is an option to get the labwork drawn at a [facility] closer to her home in [place]. I  requested that she call and provide [place] with the name and contact information including fax number for the [facility] so that we can send them an order slip.   Addendum 11/14: [name] has decided to come to [place] for the D-dimer, she request that this order is placed stat to minimize her anxiety and she understands that this may be associated with a higher cost.   See Encounters Tab for problem based charting.  Patient seen with Dr. Sandre Kitty

## 2018-11-01 NOTE — Telephone Encounter (Signed)
Thank you. I informed Dr Obie Dredge of pt and past dx.

## 2018-11-01 NOTE — Telephone Encounter (Signed)
Patient called requesting an appointment today with Dr.Granfortuna. Explained to her that he no longer works here. Gave his number to the patient. She is c/o of left leg pain. No redness, no swelling. Advised her to call them first but if they could not accomodate her than she is to go to the ER/UC. She voiced understanding of this. Scheduling message sent for patient to be put with a new provider for hematology as a new patient. It has been greater than 3 years since patient has been last seen in our office. She voiced understanding.

## 2018-11-01 NOTE — Telephone Encounter (Signed)
Pt calls and states that she is very upset, she states she is having pain in the same area of her leg as she did when she first saw you, she would like to speak to you. She is in a panic, pleasant but upset, she was told by cancer center she needed to be seen but they would not see her in hematology.could you please call her? [phone]. She states she has been traveling a lot in cars and planes. Nothing else she can think of that would have brought it on. The area of her leg is painful but not bad, she states she had no pain the last time until it was really bad. She has been ask til she hears from [facility] to decrease activity, keep leg up and if she has chest pain, short of breath, weakness, dizziness, severe H/A, N&V to call 911 Please advise

## 2018-11-02 ENCOUNTER — Ambulatory Visit (HOSPITAL_COMMUNITY)
Admission: RE | Admit: 2018-11-02 | Discharge: 2018-11-02 | Disposition: A | Discharge status: Home / Self Care | Payer: 59 | Source: Ambulatory Visit | Attending: Internal Medicine | Admitting: Internal Medicine

## 2018-11-02 DIAGNOSIS — Z86718 Personal history of other venous thrombosis and embolism: Secondary | ICD-10-CM | POA: Insufficient documentation

## 2018-11-02 DIAGNOSIS — M79605 Pain in left leg: Secondary | ICD-10-CM

## 2018-11-02 DIAGNOSIS — M79662 Pain in left lower leg: Secondary | ICD-10-CM | POA: Insufficient documentation

## 2018-11-02 NOTE — Progress Notes (Signed)
There is no evidence of deep vein thrombosis in the left lower extremity. No cystic structure found in the popliteal fossa. No evidence of right common femoral vein obstruction.  Leta Jungling RDCS

## 2018-11-07 NOTE — Progress Notes (Signed)
Internal Medicine Clinic Attending  I saw and evaluated the patient.  I personally confirmed the key portions of the history and exam documented by Dr. Obie Dredge and I reviewed pertinent patient test results.  I supervised her POCUS evaluation of the veins of the left leg.  The assessment, diagnosis, and plan were formulated together and I agree with the documentation in the resident's note.  Jessy Oto, M.D., Ph.D.

## 2018-11-11 ENCOUNTER — Other Ambulatory Visit (INDEPENDENT_AMBULATORY_CARE_PROVIDER_SITE_OTHER): Payer: 59

## 2018-11-11 ENCOUNTER — Telehealth: Payer: Self-pay | Admitting: Internal Medicine

## 2018-11-11 ENCOUNTER — Encounter: Payer: Self-pay | Admitting: Internal Medicine

## 2018-11-11 DIAGNOSIS — M79605 Pain in left leg: Secondary | ICD-10-CM | POA: Diagnosis not present

## 2018-11-11 DIAGNOSIS — M79662 Pain in left lower leg: Secondary | ICD-10-CM

## 2018-11-11 LAB — D-DIMER, QUANTITATIVE (NOT AT ARMC): D DIMER QUANT: 0.95 ug{FEU}/mL — AB (ref 0.00–0.50)

## 2018-11-11 NOTE — Telephone Encounter (Addendum)
Please see my prior office visit note regarding patient presenting for lower extremity pain. Results of the follow up D-dimer remain elevated 0.95. We will proceed with the plan for follow up doppler of the leg. I have ordered this to be performed stat and am hopeful that she can have this performed tomorrow. [name] expressed that she is feeling very anxious about the thought of having a blood clot that is going missed. She also notes that proximal veins of the legs werent checked. I spoke with Dr. Cyndie ChimeGranfortuna, he feels that we should start anticoagulation while completing this workup for the elevated D-dimer, given [name] worries I will send in a prescription for xarelto.

## 2018-11-11 NOTE — Addendum Note (Signed)
Addended by: Bufford SpikesFULCHER, Netra Postlethwait N on: 11/11/2018 01:44 PM   Modules accepted: Orders

## 2018-11-11 NOTE — Addendum Note (Signed)
Addended by: Earl LagosBLUM, Parisha Beaulac S on: 11/11/2018 11:37 AM   Modules accepted: Orders

## 2018-11-12 ENCOUNTER — Telehealth: Payer: Self-pay | Admitting: *Deleted

## 2018-11-12 ENCOUNTER — Ambulatory Visit (HOSPITAL_COMMUNITY)
Admission: RE | Admit: 2018-11-12 | Discharge: 2018-11-12 | Disposition: A | Discharge status: Home / Self Care | Payer: 59 | Source: Ambulatory Visit | Attending: Oncology | Admitting: Oncology

## 2018-11-12 ENCOUNTER — Telehealth: Payer: Self-pay

## 2018-11-12 DIAGNOSIS — M79662 Pain in left lower leg: Secondary | ICD-10-CM

## 2018-11-12 MED ORDER — RIVAROXABAN 15 MG PO TABS: 15.0000 mg | ORAL_TABLET | Freq: Two times a day (BID) | ORAL | 0 refills | Status: DC

## 2018-11-12 NOTE — Progress Notes (Addendum)
LLE venous duplex prelim: Isolated DVT noted in the left gastrocnemius vein.   Farrel DemarkJill Eunice, RDMS, RVT    Called results to Dr. Obie DredgeBlum. Instructed patient to pick up Xarelto Rx and call office to schedule f/u in 2 weeks. Also, call if there's any issues or new symptoms.

## 2018-11-12 NOTE — Addendum Note (Signed)
Addended by: Earl LagosBLUM, Makinzie Considine S on: 11/12/2018 08:27 AM   Modules accepted: Orders

## 2018-11-12 NOTE — Telephone Encounter (Addendum)
I was able to speak with [name], we discussed the doppler study findings with the ultrasound technician, she noticed a new DVT in the gastrocnemius vein. I have send a prescription for xarelto to the patients pharmacy and asked that she pick this up and begin taking the medication twice daily and follow up with us in 2-3 weeks. This is the medication and dose that we use to treat DVTs in the outpatient setting. She should take this for three months then we should reevaluate with another D-dimer, if the D-dimer is improved she can come off of the xarelto so that we can repeat hypercoagulable workup.

## 2018-11-12 NOTE — Telephone Encounter (Signed)
[facility] - " isolated clot in gastrocnemius muscle"; call given to Dr Obie DredgeBlum, who spoke to [place].

## 2018-11-12 NOTE — Telephone Encounter (Signed)
Requesting to speak with Dr Obie DredgeBlum for test results.

## 2018-11-13 ENCOUNTER — Encounter: Payer: Self-pay | Admitting: Internal Medicine

## 2018-11-15 ENCOUNTER — Other Ambulatory Visit: Payer: Self-pay | Admitting: Oncology

## 2018-11-15 DIAGNOSIS — I82461 Acute embolism and thrombosis of right calf muscular vein: Secondary | ICD-10-CM

## 2018-11-15 DIAGNOSIS — I871 Compression of vein: Secondary | ICD-10-CM

## 2018-11-17 ENCOUNTER — Ambulatory Visit (HOSPITAL_COMMUNITY)
Admission: RE | Admit: 2018-11-17 | Discharge: 2018-11-17 | Disposition: A | Discharge status: Home / Self Care | Payer: 59 | Source: Ambulatory Visit | Attending: Oncology | Admitting: Oncology

## 2018-11-17 ENCOUNTER — Encounter (HOSPITAL_COMMUNITY): Payer: Self-pay

## 2018-11-17 ENCOUNTER — Telehealth: Payer: Self-pay | Admitting: *Deleted

## 2018-11-17 ENCOUNTER — Ambulatory Visit (HOSPITAL_COMMUNITY): Admission: RE | Admit: 2018-11-17 | Payer: 59 | Source: Ambulatory Visit

## 2018-11-17 DIAGNOSIS — I82461 Acute embolism and thrombosis of right calf muscular vein: Secondary | ICD-10-CM | POA: Insufficient documentation

## 2018-11-17 DIAGNOSIS — I871 Compression of vein: Secondary | ICD-10-CM | POA: Insufficient documentation

## 2018-11-17 MED ORDER — GADOBUTROL 1 MMOL/ML IV SOLN
8.0000 mL | Freq: Once | INTRAVENOUS | Status: AC | PRN
  Administered 2018-11-17: 8 mL via INTRAVENOUS

## 2018-11-17 NOTE — Telephone Encounter (Signed)
Great. Thanks DrG 

## 2018-11-17 NOTE — Telephone Encounter (Signed)
Called pt - she's on the telephone with her insurance co. Explained Dr Cyndie ChimeGranfortuna will not change MRI order to stat/urgent. Stated she might pay out of pocket; wanted to know cost. While still on the phone w/ins company;  They approved the MRI. Authorization# [id number] per pt - given to Chilon. MRI scheduled for today @ 7PM.

## 2018-11-18 ENCOUNTER — Other Ambulatory Visit (INDEPENDENT_AMBULATORY_CARE_PROVIDER_SITE_OTHER): Payer: 59

## 2018-11-18 DIAGNOSIS — I82461 Acute embolism and thrombosis of right calf muscular vein: Secondary | ICD-10-CM

## 2018-11-18 LAB — CBC WITH DIFFERENTIAL/PLATELET
Abs Immature Granulocytes: 0.02 10*3/uL (ref 0.00–0.07)
BASOS ABS: 0 10*3/uL (ref 0.0–0.1)
BASOS PCT: 0 %
EOS ABS: 0.1 10*3/uL (ref 0.0–0.5)
EOS PCT: 1 %
HCT: 44.5 % (ref 36.0–46.0)
HEMOGLOBIN: 15.1 g/dL — AB (ref 12.0–15.0)
Immature Granulocytes: 0 %
Lymphocytes Relative: 29 %
Lymphs Abs: 1.7 10*3/uL (ref 0.7–4.0)
MCH: 29.8 pg (ref 26.0–34.0)
MCHC: 33.9 g/dL (ref 30.0–36.0)
MCV: 87.8 fL (ref 80.0–100.0)
MONO ABS: 0.4 10*3/uL (ref 0.1–1.0)
Monocytes Relative: 7 %
NRBC: 0 % (ref 0.0–0.2)
Neutro Abs: 3.6 10*3/uL (ref 1.7–7.7)
Neutrophils Relative %: 63 %
Platelets: 161 10*3/uL (ref 150–400)
RBC: 5.07 MIL/uL (ref 3.87–5.11)
RDW: 12 % (ref 11.5–15.5)
WBC: 5.9 10*3/uL (ref 4.0–10.5)

## 2018-11-18 LAB — COMPREHENSIVE METABOLIC PANEL
ALK PHOS: 46 U/L (ref 38–126)
ALT: 21 U/L (ref 0–44)
ANION GAP: 5 (ref 5–15)
AST: 21 U/L (ref 15–41)
Albumin: 3.8 g/dL (ref 3.5–5.0)
BUN: 12 mg/dL (ref 6–20)
CALCIUM: 8.9 mg/dL (ref 8.9–10.3)
CO2: 26 mmol/L (ref 22–32)
Chloride: 107 mmol/L (ref 98–111)
Creatinine, Ser: 1.06 mg/dL — ABNORMAL HIGH (ref 0.44–1.00)
GFR calc non Af Amer: >60 mL/min (ref >60)
Glucose, Bld: 107 mg/dL — ABNORMAL HIGH (ref 70–99)
POTASSIUM: 4 mmol/L (ref 3.5–5.1)
Sodium: 138 mmol/L (ref 135–145)
TOTAL PROTEIN: 6.6 g/dL (ref 6.5–8.1)
Total Bilirubin: 0.8 mg/dL (ref 0.3–1.2)

## 2018-11-19 ENCOUNTER — Telehealth: Payer: Self-pay | Admitting: *Deleted

## 2018-11-19 NOTE — Telephone Encounter (Signed)
Pt called / informed "lab all good " per Dr Cyndie ChimeGranfortuna. She stated he had called and gave her the MRI report "which was good". Also stated he was going to try to see her 1st or 2nd week in December - I will send message to Lennar CorporationDoris S.

## 2018-11-20 ENCOUNTER — Ambulatory Visit (HOSPITAL_COMMUNITY): Payer: 59

## 2018-11-21 ENCOUNTER — Telehealth: Payer: Self-pay | Admitting: Internal Medicine

## 2018-11-21 NOTE — Telephone Encounter (Signed)
   Reason for call:   I received a call from [name] at 3:45 AM indicating heavy bleeding.   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 She states she has been tolerating well until today. She noted she started her period, however it has been significantly heavier than previous, soaking through her tampons, clothes and her bedding. She noted that when she stood up to get to the bathroom after waking up, she experienced nausea and dizziness that improved with lying down.  Patient states [place] is out of town, in [place] currently.   Assessment / Plan / Recommendations:   Patient with significant what sounds like significant uterine bleeding in setting of Xarelto use, with symptoms.   I advised that she be evaluated in the ED ASAP to manage possible hypotension and anemia.  I will forward to Dr. Cyndie ChimeGranfortuna to make him aware.   Nyra MarketSvalina, Jora Galluzzo, MD   11/21/2018, 3:53 AM

## 2018-11-22 ENCOUNTER — Telehealth: Payer: Self-pay | Admitting: *Deleted

## 2018-11-22 NOTE — Telephone Encounter (Signed)
Call from pt - stated her menstrual cycle started and on Saturday she was "pouring blood. She's in [place]; so she went to the ED after talking to resident on call for the w/e (she Dr Kandice HamsSvalina's telephone note). Stated the OB[place]N doctor in [place] started her on Medroxyprogesterone 10 mg TID. She wanted to be sure this was ok. Stated the bleeding has slowed down.  Her phone# [phone]. Thanks

## 2018-11-23 ENCOUNTER — Other Ambulatory Visit: Payer: Self-pay | Admitting: Oncology

## 2018-11-23 ENCOUNTER — Ambulatory Visit (HOSPITAL_COMMUNITY): Payer: 59

## 2018-11-23 DIAGNOSIS — I871 Compression of vein: Secondary | ICD-10-CM

## 2018-11-23 DIAGNOSIS — I82461 Acute embolism and thrombosis of right calf muscular vein: Secondary | ICD-10-CM

## 2018-11-23 DIAGNOSIS — N939 Abnormal uterine and vaginal bleeding, unspecified: Secondary | ICD-10-CM

## 2018-11-23 DIAGNOSIS — O871 Deep phlebothrombosis in the puerperium: Secondary | ICD-10-CM

## 2018-11-23 NOTE — Telephone Encounter (Signed)
I spoke with her yesterday. Please put her on my schedule for 11:15 AM Monday 12/2; lab day of visit

## 2018-11-23 NOTE — Telephone Encounter (Signed)
Thanks for fielding the call. I spoke w her yesterday. She got seen by a GYN - got hormone shot; bleeding subsiding; I will work her in for a visit in my clinic next week.  G

## 2018-11-24 ENCOUNTER — Encounter: Payer: Self-pay | Admitting: Oncology

## 2018-11-24 ENCOUNTER — Other Ambulatory Visit: Payer: Self-pay | Admitting: *Deleted

## 2018-11-24 ENCOUNTER — Other Ambulatory Visit: Payer: Self-pay

## 2018-11-24 NOTE — Telephone Encounter (Signed)
Rivaroxaban (XARELTO) 15 MG TABS tablet, refill request @  [id number] [place] [facility] [place], [place] - [phone] (Phone) [phone] (Fax)   Pt states he needs this med by today.

## 2018-11-24 NOTE — Progress Notes (Signed)
Phone conversation with patient: See recent notes.  47 year old woman who had a iliac vein thrombosis postpartum in 2013.  Short-term anticoagulation.  Iliac stent placed.  Recently evaluated for right calf pain on November 5.  Initial Doppler study negative.  Follow-up study on November 15+ for DVT in the right gastrocs vein.  She was started on Xarelto standard loading dose 15 mg twice daily for 3 weeks. She called a few days ago to report profuse menorrhagia.  [place]  She went to a local ED.  OB/GYN prescribed hormonal therapy which has been successful in stopping the bleeding. She called the office today to say that she only had one Xarelto tablet left. I called her pharmacy.  In view of the menorrhagia, I am not going to extend the loading dose for another week.  I am going to prescribe a single 20 mg daily maintenance dose to begin tomorrow.  She already took her a.m. dose of the Xarelto today.  She was instructed to take the p.m. dose and start the 20 mg single daily dose tomorrow. I have a work in appointment for her on Monday, December 2.

## 2018-11-24 NOTE — Telephone Encounter (Signed)
Started new encounter, spoke to dr Cyndie Chimegranfortuna, he will [facility] and explain how the xarelto should be dosed and pt is to see him mon 12/2. Attempted to call pt to speak to her, call went straight to vmail, lm for rtc by [phone]

## 2018-11-29 ENCOUNTER — Other Ambulatory Visit: Payer: Self-pay

## 2018-11-29 ENCOUNTER — Ambulatory Visit (INDEPENDENT_AMBULATORY_CARE_PROVIDER_SITE_OTHER): Payer: 59 | Admitting: Oncology

## 2018-11-29 VITALS — BP 127/67 | HR 70 | Temp 98.0°F | Wt 179.9 lb

## 2018-11-29 DIAGNOSIS — I82461 Acute embolism and thrombosis of right calf muscular vein: Secondary | ICD-10-CM

## 2018-11-29 DIAGNOSIS — F419 Anxiety disorder, unspecified: Secondary | ICD-10-CM

## 2018-11-29 DIAGNOSIS — M79605 Pain in left leg: Secondary | ICD-10-CM

## 2018-11-29 DIAGNOSIS — Z888 Allergy status to other drugs, medicaments and biological substances status: Secondary | ICD-10-CM

## 2018-11-29 DIAGNOSIS — Z7901 Long term (current) use of anticoagulants: Secondary | ICD-10-CM

## 2018-11-29 DIAGNOSIS — Z86718 Personal history of other venous thrombosis and embolism: Secondary | ICD-10-CM

## 2018-11-29 DIAGNOSIS — E049 Nontoxic goiter, unspecified: Secondary | ICD-10-CM | POA: Diagnosis not present

## 2018-11-29 DIAGNOSIS — I82462 Acute embolism and thrombosis of left calf muscular vein: Secondary | ICD-10-CM

## 2018-11-29 DIAGNOSIS — R000 Tachycardia, unspecified: Secondary | ICD-10-CM

## 2018-11-29 DIAGNOSIS — O871 Deep phlebothrombosis in the puerperium: Secondary | ICD-10-CM

## 2018-11-29 DIAGNOSIS — Z881 Allergy status to other antibiotic agents status: Secondary | ICD-10-CM

## 2018-11-29 DIAGNOSIS — N939 Abnormal uterine and vaginal bleeding, unspecified: Secondary | ICD-10-CM

## 2018-11-29 DIAGNOSIS — R002 Palpitations: Secondary | ICD-10-CM | POA: Diagnosis not present

## 2018-11-29 LAB — CBC WITH DIFFERENTIAL/PLATELET
ABS IMMATURE GRANULOCYTES: 0.03 10*3/uL (ref 0.00–0.07)
Basophils Absolute: 0 10*3/uL (ref 0.0–0.1)
Basophils Relative: 0 %
Eosinophils Absolute: 0.1 10*3/uL (ref 0.0–0.5)
Eosinophils Relative: 2 %
HCT: 45.9 % (ref 36.0–46.0)
Hemoglobin: 14.7 g/dL (ref 12.0–15.0)
IMMATURE GRANULOCYTES: 0 %
LYMPHS ABS: 2.3 10*3/uL (ref 0.7–4.0)
Lymphocytes Relative: 31 %
MCH: 28.9 pg (ref 26.0–34.0)
MCHC: 32 g/dL (ref 30.0–36.0)
MCV: 90.2 fL (ref 80.0–100.0)
MONOS PCT: 6 %
Monocytes Absolute: 0.5 10*3/uL (ref 0.1–1.0)
NEUTROS PCT: 61 %
Neutro Abs: 4.4 10*3/uL (ref 1.7–7.7)
Platelets: 211 10*3/uL (ref 150–400)
RBC: 5.09 MIL/uL (ref 3.87–5.11)
RDW: 12.2 % (ref 11.5–15.5)
WBC: 7.3 10*3/uL (ref 4.0–10.5)
nRBC: 0 % (ref 0.0–0.2)

## 2018-11-29 LAB — D-DIMER, QUANTITATIVE: D-Dimer, Quant: 0.43 ug/mL-FEU (ref 0.00–0.50)

## 2018-11-29 MED ORDER — RIVAROXABAN 20 MG PO TABS: 20.0000 mg | ORAL_TABLET | Freq: Every day | ORAL | Status: DC

## 2018-11-29 NOTE — Progress Notes (Signed)
Hematology and Oncology Follow Up Visit  [name] [id number] 1971/01/23 47 y.o. 11/29/2018 6:24 PM   Principle Diagnosis: Encounter Diagnoses  Name Primary?  . Chronic anticoagulation   . Acute deep vein thrombosis (DVT) of calf muscle vein of left lower extremity   . Postpartum deep vein thrombosis   . Enlarged thyroid gland Yes  . Acute deep vein thrombosis (DVT) of calf muscle vein of right lower extremity   Interim summary: 47 year old woman who had a left iliac, left pelvic, and femoral vein thrombosis which likely occurred in her third trimester but was not diagnosed until postpartum with her third pregnancy in October, 2013.  An iliac stent was placed.  A hypercoagulation evaluation was unremarkable.  She was put on short-term anticoagulation for 6 months with Xarelto. Last contact with our office was on October 04, 2014.  She reported a 3-week history of pain starting in the center of her left buttock and radiating down the back of her leg.  She was traveling frequently back and forth to [facility] and had to stop at times to get out of the car to relieve the pain.  Doppler study was done and showed no evidence for DVT.  I recommended trying a nonsteroidal anti-inflammatory such as ibuprofen 3-4 times daily and to call if symptoms persisted or progressed. She was seen by orthopedic surgery to evaluate low back pain in March 2018.  MRI of the lumbar spine was unremarkable.  There was a possible labral tear versus early degenerative joint disease on the right. She did not keep any follow-ups in our office until she called on November 01, 2018 complaining of recurrent pain radiating down the left leg in a sciatic distribution.  She was evaluated by Dr. Eulah Pont and Blanch Media in our general medicine clinic the same day.  I was out of town. Physical findings were tenderness in the left calf.  A d-dimer was significantly elevated at 1.0.  Venous Doppler studies did  not show any acute thrombosis but I had a high index of suspicion that she did in fact have a new event.  Both d-dimer and venous Doppler studies were repeated 2 weeks later on November 14 & 15.  D-dimer remained elevated at 0.95.  This time the venous Doppler study did show a DVT localized to the left gastrocnemius vein.  She was put back on anticoagulation with Xarelto initial loading dose 15 mg twice daily.  Due to her concern that her symptoms were similar to when she had the pelvic vein thrombosis, I obtained an MRI of her pelvis on November 20 which did not show any recurrent pelvic vein thrombosis.  The iliac stent was patent.  She called the office again on November 24.  She was having significant menorrhagia.  She was out of town.  She was referred to the nearest hospital.  She was seen by an OB/GYN and given hormonal therapy.  Her bleeding subsided and has now stopped. I elected to terminate the 15 mg twice daily loading dose at that time and beginning on November 25 she was advised to change to a single 20 mg daily dose. She was instructed to discuss further hormonal manipulation of her cycle with her gynecologist Dr. Rana Snare.  She is now concerned because she is going to [place] over the Christmas holiday and that will coincide with her next cycle.  She continues to have atypical discomfort radiating down the back of the left leg.  Medications:  Current Outpatient Medications:  .  ALPRAZolam (XANAX) 0.5 MG tablet, Take 0.5 mg by mouth every 8 (eight) hours as needed for anxiety. , Disp: , Rfl: 0 .  Ascorbic Acid (VITAMIN C) 1000 MG tablet, Take 1,000 mg by mouth daily., Disp: , Rfl:  .  atenolol (TENORMIN) 25 MG tablet, Take 1 tablet (25 mg total) by mouth daily., Disp: 90 tablet, Rfl: 3 .  cetirizine (ZYRTEC) 10 MG tablet, Take 10 mg by mouth daily., Disp: , Rfl:  .  diphenhydrAMINE (BENADRYL) 25 mg capsule, Take by mouth., Disp: , Rfl:  .  ranitidine (ZANTAC) 150 MG tablet, Take  150 mg by mouth as needed for heartburn., Disp: , Rfl:  .  Rivaroxaban (XARELTO) 20 mg daily   Allergies:  Allergies  Allergen Reactions  . Azithromycin Hives  . Metoprolol Hives  . Moxifloxacin Hives  . Avelox [Moxifloxacin Hcl In Nacl] Hives  . Moxifloxacin Hcl Hives   Family history: Father had a stroke when he was in his 3540s and died of an MI at age 45.  She has one female sibling age 45 who is healthy and has had no clotting problems.  She has 3 sons age 556, 4020, and 6724 or healthy.  Review of Systems:  See interim history She has a long history of intermittent palpitations with the only findings on monitors being sinus tachycardia.  She has been followed by cardiology. Remaining ROS negative:   Physical Exam: Blood pressure 127/67, pulse 70, temperature 98 F (36.7 C), temperature source Oral, weight 179 lb 14.4 oz (81.6 kg), SpO2 100 %. Wt Readings from Last 3 Encounters:  11/29/18 179 lb 14.4 oz (81.6 kg)  11/01/18 181 lb 4.8 oz (82.2 kg)  07/22/18 183 lb 3.2 oz (83.1 kg)     General appearance: Well-nourished Caucasian female HENNT: Pharynx no erythema, exudate, mass, or ulcer. No thyromegaly or thyroid nodules Lymph nodes: No cervical, supraclavicular, or axillary lymphadenopathy Breasts:  Lungs: Clear to auscultation, resonant to percussion throughout Heart: Regular rhythm, no murmur, no gallop, no rub, no click, no edema Abdomen: Soft, nontender, normal bowel sounds, no mass, no organomegaly Extremities: No edema, currently no calf tenderness Musculoskeletal: no joint deformities Calf measurements: 37 cm on the right, 38 cm on the left Ankle measurements: 21 cm on the right, 20.5 cm on the left. GU:  Vascular: Carotid pulses 2+, no bruits, distal pulses: Dorsalis pedis 1+ symmetric Neurologic: Alert, oriented, PERRLA, , cranial nerves grossly normal, motor strength 5 over 5, reflexes 1+ symmetric, upper body coordination normal, gait normal, no pain on straight leg  raising or flexion of the knee on the hip with subsequent extension to suggest sciatica. Skin: No rash or ecchymosis  Lab Results: CBC W/Diff    Component Value Date/Time   WBC 7.3 11/29/2018 1243   RBC 5.09 11/29/2018 1243   HGB 14.7 11/29/2018 1243   HGB 15.4 07/22/2018 1125   HGB 15.2 10/05/2014 0914   HCT 45.9 11/29/2018 1243   HCT 44.0 07/22/2018 1125   HCT 43.4 10/05/2014 0914   PLT 211 11/29/2018 1243   PLT 175 07/22/2018 1125   MCV 90.2 11/29/2018 1243   MCV 88 07/22/2018 1125   MCV 87.0 10/05/2014 0914   MCH 28.9 11/29/2018 1243   MCHC 32.0 11/29/2018 1243   RDW 12.2 11/29/2018 1243   RDW 13.7 07/22/2018 1125   RDW 12.2 10/05/2014 0914   LYMPHSABS 2.3 11/29/2018 1243   LYMPHSABS 1.7 10/05/2014 0914  MONOABS 0.5 11/29/2018 1243   MONOABS 0.3 10/05/2014 0914   EOSABS 0.1 11/29/2018 1243   EOSABS 0.1 10/05/2014 0914   BASOSABS 0.0 11/29/2018 1243   BASOSABS 0.0 10/05/2014 0914     Chemistry      Component Value Date/Time   NA 138 11/18/2018 1047   NA 140 07/22/2018 1125   K 4.0 11/18/2018 1047   CL 107 11/18/2018 1047   CO2 26 11/18/2018 1047   BUN 12 11/18/2018 1047   BUN 12 07/22/2018 1125   CREATININE 1.06 (H) 11/18/2018 1047      Component Value Date/Time   CALCIUM 8.9 11/18/2018 1047   ALKPHOS 46 11/18/2018 1047   AST 21 11/18/2018 1047   ALT 21 11/18/2018 1047   BILITOT 0.8 11/18/2018 1047    D-dimer today November 29, 2018 back in normal range at 0.48.   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 Now new LLE DVT & right lumbar level paraspinal pain. R/O stent thrombosis, EXAM: MRA  PELVIS WITH CONTRAST TECHNIQUE: Multiplanar, multiecho pulse sequences of the pelvis were obtained with intravenous contrast. Angiographic images of pelvis were obtained using MRA technique with intravenous contrast.  CONTRAST:  8 mL Gadavist IV COMPARISON:  10/09/2014, and previous studies FINDINGS: Unremarkable bilateral iliac arterial anatomy with patent bilateral superficial femoral arteries. Bilateral femoral and common femoral veins are patent, nondistended. Patent right iliac venous system. Patent left external iliac vein. Mild metallic susceptibility artifact in the region of the left common iliac venous stent with low-level internal signal consistent with continued patency. No significant pelvic  or body wall venous collaterals. Visualized infrarenal IVC unremarkable. Urinary bladder incompletely distended. Uterus and adnexal regions unremarkable. Trace pelvic ascites, probably physiologic. Regional bones unremarkable. Visualized segments of small bowel and colon are decompressed. No areas of unexpected enhancement after IV contrast administration. IMPRESSION: 1. No evidence of iliofemoral DVT nor left common iliac venous stent occlusion. Electronically Signed   By: Corlis Leak M.D.   On: 11/18/2018 09:41   Vas [place] Lower Extremity Venous (dvt)  Result Date: 11/14/2018  Lower Venous Study Indications: Pain.  Comparison Study: 11/02/18 Performing Technologist: Farrel Demark RDMS, RVT  Examination Guidelines: A complete evaluation includes B-mode imaging, spectral Doppler, color Doppler, and power Doppler as needed of all accessible portions of each vessel. Bilateral testing is considered an integral part of a complete examination. Limited examinations for reoccurring indications may be performed as noted.  Right Venous Findings: +---+---------------+---------+-----------+----------+-------+    CompressibilityPhasicitySpontaneityPropertiesSummary +---+---------------+---------+-----------+----------+-------+ CFVFull           Yes      Yes                          +---+---------------+---------+-----------+----------+-------+  Left Venous Findings:  +---------+---------------+---------+-----------+----------+-------+          CompressibilityPhasicitySpontaneityPropertiesSummary +---------+---------------+---------+-----------+----------+-------+ CFV      Full           Yes      Yes                          +---------+---------------+---------+-----------+----------+-------+ SFJ      Full                                                 +---------+---------------+---------+-----------+----------+-------+  FV Prox  Full                                                 +---------+---------------+---------+-----------+----------+-------+ FV Mid   Full                                                 +---------+---------------+---------+-----------+----------+-------+ FV DistalFull                                                 +---------+---------------+---------+-----------+----------+-------+ PFV      Full                                                 +---------+---------------+---------+-----------+----------+-------+ POP      Full           Yes      Yes                          +---------+---------------+---------+-----------+----------+-------+ PTV      Full                                                 +---------+---------------+---------+-----------+----------+-------+ PERO     Full                                                 +---------+---------------+---------+-----------+----------+-------+ Gastroc  None                               dilated   Acute   +---------+---------------+---------+-----------+----------+-------+    Summary: Right: No evidence of common femoral vein obstruction. Left: Findings consistent with acute isolated deep vein thrombosis involving the left gastrocnemius vein. No cystic structure found in the popliteal fossa.  *See table(s) above for measurements and observations. Electronically signed by Fabienne Bruns MD on 11/14/2018 at 9:24:19 AM.    Final     Vas [place] Lower Extremity Venous (dvt)  Result Date: 11/02/2018  Lower Venous Study Indications: Pain.  Risk Factors: Surgery post-partum DVT), s/p thrombectomy left iliofemoral thrombus and stent/angioplasty left common iliac high grade stenosis. Performing Technologist: Leta Jungling RDCS  Examination Guidelines: A complete evaluation includes B-mode imaging, spectral Doppler, color Doppler, and power Doppler as needed of all accessible portions of each vessel. Bilateral testing is considered an integral part of a complete examination. Limited examinations for reoccurring indications may be performed as noted.  Right Venous Findings: +---+---------------+---------+-----------+----------+-------+    CompressibilityPhasicitySpontaneityPropertiesSummary +---+---------------+---------+-----------+----------+-------+ CFVFull           Yes      Yes                          +---+---------------+---------+-----------+----------+-------+  SFJFull                                                 +---+---------------+---------+-----------+----------+-------+  Left Venous Findings: +---------+---------------+---------+-----------+----------+-------+          CompressibilityPhasicitySpontaneityPropertiesSummary +---------+---------------+---------+-----------+----------+-------+ CFV      Full           Yes      Yes                          +---------+---------------+---------+-----------+----------+-------+ SFJ      Full                                                 +---------+---------------+---------+-----------+----------+-------+ FV Prox  Full                                                 +---------+---------------+---------+-----------+----------+-------+ FV Mid   Full                                                 +---------+---------------+---------+-----------+----------+-------+ FV DistalFull                                                  +---------+---------------+---------+-----------+----------+-------+ POP      Full                                                 +---------+---------------+---------+-----------+----------+-------+ PTV      Full                                                 +---------+---------------+---------+-----------+----------+-------+ PERO     Full                                                 +---------+---------------+---------+-----------+----------+-------+    Summary: Right: No evidence of common femoral vein obstruction. Left: There is no evidence of deep vein thrombosis in the lower extremity. No cystic structure found in the popliteal fossa.  *See table(s) above for measurements and observations. Electronically signed by Coral Else MD on 11/02/2018 at 6:54:01 PM.    Final     Impression:  1.  Unprovoked distal DVT right gastrocs vein. 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  Extensive negative hypercoagulation profile at that time. We had a lengthy discussion.  There does not seem to be an anatomic reason for this clot given the recent pelvic MRI.  No recurrent pelvic thrombosis.  Iliac stent is patent. I recommended that she continue full dose anticoagulation for 6 months and then consider dose reduction by 50%.  2.  Menorrhagia related to anticoagulation There are anecdotal reports that apixaban may be associated with a decreased frequency of menorrhagia compared with rivaroxaban and if she continues to have problems on the Xarelto, I will change her to Eliquis.  3.  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  See discussion above.  4.  Persistent intermittent atypical buttock and posterior leg pain with no good anatomic or imaging correlation.  This appears to be musculoskeletal in nature at this point.  No specific recommendation.  5.   Intermittent palpitations and sinus tachycardia and an anxious woman.  CC: Patient Care Team: Patient, [name] Per as PCP - General (General Practice)   Cephas Darby, MD, FACP  Hematology-Oncology/Internal Medicine     12/2/20196:24 PM

## 2018-11-29 NOTE — Patient Instructions (Signed)
To lab today Return visit 8-10 weeks 

## 2018-11-30 ENCOUNTER — Other Ambulatory Visit: Payer: Self-pay | Admitting: *Deleted

## 2018-11-30 LAB — TSH: TSH: 0.785 u[IU]/mL (ref 0.450–4.500)

## 2018-11-30 LAB — T4, FREE: FREE T4: 1.25 ng/dL (ref 0.82–1.77)

## 2018-11-30 NOTE — Telephone Encounter (Signed)
Rx from 12/2 was "No Print";  not sent electronic Please re-send. Thanks

## 2018-12-01 ENCOUNTER — Telehealth: Payer: Self-pay | Admitting: *Deleted

## 2018-12-01 NOTE — Telephone Encounter (Signed)
Pt called / informed "thyroid functions normal; d-dimer back in normal range. Hb is normal at 14.7 so she did not lose that much blood with her period." per Dr Cyndie ChimeGranfortuna. Stated thank you for calling and will see us in Jan.

## 2018-12-01 NOTE — Telephone Encounter (Signed)
-----   Message from Levert FeinsteinJames M Granfortuna, MD sent at 11/30/2018 10:23 AM EST ----- Call pt: thyroid functions normal

## 2018-12-02 ENCOUNTER — Other Ambulatory Visit: Payer: Self-pay | Admitting: Oncology

## 2018-12-02 MED ORDER — RIVAROXABAN 20 MG PO TABS: 20.0000 mg | ORAL_TABLET | Freq: Every day | ORAL | 5 refills | Status: DC

## 2018-12-02 NOTE — Telephone Encounter (Signed)
done

## 2018-12-06 ENCOUNTER — Ambulatory Visit: Payer: 59 | Admitting: Internal Medicine

## 2018-12-16 ENCOUNTER — Encounter: Payer: Self-pay | Admitting: Internal Medicine

## 2019-01-24 ENCOUNTER — Encounter: Payer: Self-pay | Admitting: *Deleted

## 2019-01-25 ENCOUNTER — Other Ambulatory Visit: Payer: Self-pay

## 2019-01-25 ENCOUNTER — Encounter: Payer: Self-pay | Admitting: Oncology

## 2019-01-25 ENCOUNTER — Ambulatory Visit: Payer: 59 | Admitting: Oncology

## 2019-01-25 VITALS — BP 135/77 | HR 76 | Temp 98.3°F | Ht 68.0 in | Wt 180.8 lb

## 2019-01-25 DIAGNOSIS — Z888 Allergy status to other drugs, medicaments and biological substances status: Secondary | ICD-10-CM

## 2019-01-25 DIAGNOSIS — N920 Excessive and frequent menstruation with regular cycle: Secondary | ICD-10-CM

## 2019-01-25 DIAGNOSIS — Z881 Allergy status to other antibiotic agents status: Secondary | ICD-10-CM

## 2019-01-25 DIAGNOSIS — D720 Genetic anomalies of leukocytes: Secondary | ICD-10-CM

## 2019-01-25 DIAGNOSIS — Z7901 Long term (current) use of anticoagulants: Secondary | ICD-10-CM

## 2019-01-25 DIAGNOSIS — Z86718 Personal history of other venous thrombosis and embolism: Secondary | ICD-10-CM | POA: Diagnosis not present

## 2019-01-25 DIAGNOSIS — D696 Thrombocytopenia, unspecified: Secondary | ICD-10-CM | POA: Diagnosis not present

## 2019-01-25 DIAGNOSIS — M5442 Lumbago with sciatica, left side: Secondary | ICD-10-CM | POA: Diagnosis not present

## 2019-01-25 DIAGNOSIS — O871 Deep phlebothrombosis in the puerperium: Secondary | ICD-10-CM

## 2019-01-25 DIAGNOSIS — I82562 Chronic embolism and thrombosis of left calf muscular vein: Secondary | ICD-10-CM | POA: Diagnosis not present

## 2019-01-25 DIAGNOSIS — Z8679 Personal history of other diseases of the circulatory system: Secondary | ICD-10-CM

## 2019-01-25 NOTE — Progress Notes (Signed)
Hematology and Oncology Follow Up Visit  [name] [id number] 04/03/1971 48 y.o. 01/25/2019 5:06 PM   Principle Diagnosis: Encounter Diagnoses  Name Primary?  . 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 Deep phlebothrombosis, postpartum   . History of atrial tachycardia   . Thrombocytopenia [facility])   Clinical summary: 48 year old woman who had a left iliac, left pelvic, and femoral vein thrombosis which likely occurred in her third trimester but was not diagnosed until postpartum with her third pregnancy in October, 2013.  An iliac stent was placed.  A hypercoagulation evaluation was unremarkable.  She was put on short-term anticoagulation for 6 months with Xarelto. Last contact with our office was on October 04, 2014. She reported a 3-week history of pain starting in the center of her left buttock and radiating down the back of her leg .  She was traveling frequently back and forth to [facility] and had to stop at times to get out of the car to relieve the pain.  Doppler study was done and showed no evidence for DVT.  I recommended trying a nonsteroidal anti-inflammatory such as ibuprofen 3-4 times daily and to call if symptoms persisted or progressed.  We did not hear from her again until November 2019. She was seen by orthopedic surgery to evaluate low back pain in March 2018.  MRI of the lumbar spine was unremarkable.  There was a possible labral tear versus early degenerative joint disease on the right. She did not keep any follow-ups in our office until she called on November 01, 2018 complaining of recurrent pain radiating down the left leg in a sciatic distribution.  She was evaluated by Dr. Eulah Pont and [name] in our general medicine clinic the same day.  I was out of town. Physical findings were tenderness in the left calf.  A d-dimer was significantly elevated at 1.0.  Venous Doppler studies did not show any acute  thrombosis but I had a high index of suspicion that she did in fact have a new event.  Both d-dimer and venous Doppler studies were repeated 2 weeks later on November 14 & 15.  D-dimer remained elevated at 0.95.  This time the venous Doppler study did show a DVT localized to the left gastrocnemius vein.  She was put back on anticoagulation with Xarelto initial loading dose 15 mg twice daily.  Due to her concern that her symptoms were similar to when she had the pelvic vein thrombosis, I obtained an MRI of her pelvis on November 20 which did not show any recurrent pelvic vein thrombosis.  The iliac stent was patent.  She called the office again on November 24.  She was having significant menorrhagia.  She was out of town.  She was referred to the nearest hospital.  She was seen by an OB/GYN and given hormonal therapy.  Her bleeding subsided and has now stopped as of her November 29, 2018 visit with me. I elected to terminate the 15 mg twice daily loading dose at that time and beginning on November 25 she was advised to change to a single 20 mg daily dose. She was instructed to discuss further hormonal manipulation of her cycle with her gynecologist Dr. Rana Snare.    She continued to have atypical discomfort radiating down the back of the left leg.  Interim History: She has been back on Xarelto 20 mg daily since December 2..  She is still experiencing  atypical pain in her left lower extremity.  Pain is more proximal than 1 would expect from a distal gastrocs clot.  She has had no leg swelling.  No dyspnea or chest pain. She is still having episodic back pain but admits it is much less frequent and severe now that she knows that she did not have a recurrent pelvic DVT. Her menses initially slowed without any change in the Xarelto but now she is bleeding for about 14 days.  Heavy for the first week.  Light for the second week. She saw her OB/GYN Dr. Rana SnareLowe.  He recommended consideration of a ablation procedure.   This would necessitate her stopping her Xarelto for 2 days and the procedure itself would present a thrombotic risk.  Medications: reviewed  Allergies:  Allergies  Allergen Reactions  . Azithromycin Hives  . Metoprolol Hives  . Moxifloxacin Hives  . Avelox [Moxifloxacin Hcl In Nacl] Hives  . Moxifloxacin Hcl Hives    Review of Systems: See interim history Remaining ROS negative:   Physical Exam: Blood pressure 135/77, pulse 76, temperature 98.3 F (36.8 C), temperature source Oral, height 5\' 8"  (1.727 m), weight 180 lb 12.8 oz (82 kg), SpO2 99 %. Wt Readings from Last 3 Encounters:  01/25/19 180 lb 12.8 oz (82 kg)  11/29/18 179 lb 14.4 oz (81.6 kg)  11/01/18 181 lb 4.8 oz (82.2 kg)     General appearance: Well-nourished Caucasian woman HENNT: Pharynx no erythema, exudate, mass, or ulcer. No thyromegaly or thyroid nodules Lymph nodes: No cervical, supraclavicular, or axillary lymphadenopathy Breasts:  Lungs: Clear to auscultation, resonant to percussion throughout Heart: Regular rhythm, no murmur, no gallop, no rub, no click, no edema Abdomen: Soft, nontender, normal bowel sounds, no mass, no organomegaly Extremities: No edema, no calf tenderness Calf measurements: Right 27.5 cm, left 28 cm Ankle measurements: Right 21 cm, left 20 cm  Musculoskeletal: no joint deformities GU:  Vascular: Carotid pulses 2+, no bruits, Neurologic: Alert, oriented, PERRLA, optic discs sharp and vessels normal, no hemorrhage or exudate, cranial nerves grossly normal, motor strength 5 over 5, reflexes 1+ symmetric, upper body coordination normal, gait normal, Skin: No rash or ecchymosis  Lab Results: CBC W/Diff    Component Value Date/Time   WBC 7.3 11/29/2018 1243   RBC 5.09 11/29/2018 1243   HGB 14.7 11/29/2018 1243   HGB 15.4 07/22/2018 1125   HGB 15.2 10/05/2014 0914   HCT 45.9 11/29/2018 1243   HCT 44.0 07/22/2018 1125   HCT 43.4 10/05/2014 0914   PLT 211 11/29/2018 1243   PLT  175 07/22/2018 1125   MCV 90.2 11/29/2018 1243   MCV 88 07/22/2018 1125   MCV 87.0 10/05/2014 0914   MCH 28.9 11/29/2018 1243   MCHC 32.0 11/29/2018 1243   RDW 12.2 11/29/2018 1243   RDW 13.7 07/22/2018 1125   RDW 12.2 10/05/2014 0914   LYMPHSABS 2.3 11/29/2018 1243   LYMPHSABS 1.7 10/05/2014 0914   MONOABS 0.5 11/29/2018 1243   MONOABS 0.3 10/05/2014 0914   EOSABS 0.1 11/29/2018 1243   EOSABS 0.1 10/05/2014 0914   BASOSABS 0.0 11/29/2018 1243   BASOSABS 0.0 10/05/2014 0914     Chemistry      Component Value Date/Time   NA 138 11/18/2018 1047   NA 140 07/22/2018 1125   K 4.0 11/18/2018 1047   CL 107 11/18/2018 1047   CO2 26 11/18/2018 1047   BUN 12 11/18/2018 1047   BUN 12 07/22/2018 1125   CREATININE 1.06 (  H) 11/18/2018 1047      Component Value Date/Time   CALCIUM 8.9 11/18/2018 1047   ALKPHOS 46 11/18/2018 1047   AST 21 11/18/2018 1047   ALT 21 11/18/2018 1047   BILITOT 0.8 11/18/2018 1047       Radiological Studies: No results found.  Impression:  1.  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  Iliac stent placed. Recent November 2019 unprovoked distal left lower extremity DVT.  Previous hypercoagulable evaluation negative for inherited or acquired coagulopathy.  We had another lengthy discussion about provoked versus unprovoked blood clots and short-term versus extended anticoagulation.  I do think that she needs to be on extended anticoagulation since she has declared herself with a recurrent thrombotic event.  However I think there is good literature to support for decreasing her down to 50% on the Xarelto after 6 months of full dose.  This time point would be in May of this year.  2.  Low-grade menorrhagia on Xarelto Today's lab: Hemoglobin remains normal at 15 g despite her history of menorrhagia. We will be making a dose reduction in a few months.  Rather than have her undergo an endometrial  ablation procedure at this time, and in view of the fact that she is not losing a significant amount of blood, I would prefer to just watch and wait.       CC: Patient Care Team: Patient, [name] Per as PCP - General (General Practice) Dr. Candice Campavid Lowe  Cephas DarbyJames Chizuko Trine, MD, FACP  Hematology-Oncology/Internal Medicine     1/28/20205:06 PM

## 2019-01-25 NOTE — Patient Instructions (Signed)
To lab today Return visit with Dr Reece Agar as needed before 03/30/19 Stay on 20 mg of Xarelto for now. Re-evaluate for a 50% dose reduction in May when you complete 6 months at full dose.  We will make a referral to one of the Hematologists at [facility] for your ongoing Hematology care.

## 2019-01-26 ENCOUNTER — Telehealth: Payer: Self-pay | Admitting: *Deleted

## 2019-01-26 LAB — CBC WITH DIFFERENTIAL/PLATELET
Basophils Absolute: 0 10*3/uL (ref 0.0–0.2)
Basos: 0 %
EOS (ABSOLUTE): 0.1 10*3/uL (ref 0.0–0.4)
Eos: 1 %
Hematocrit: 44.1 % (ref 34.0–46.6)
Hemoglobin: 15.1 g/dL (ref 11.1–15.9)
IMMATURE GRANS (ABS): 0 10*3/uL (ref 0.0–0.1)
Immature Granulocytes: 0 %
Lymphocytes Absolute: 1.8 10*3/uL (ref 0.7–3.1)
Lymphs: 25 %
MCH: 30.5 pg (ref 26.6–33.0)
MCHC: 34.2 g/dL (ref 31.5–35.7)
MCV: 89 fL (ref 79–97)
Monocytes Absolute: 0.5 10*3/uL (ref 0.1–0.9)
Monocytes: 8 %
Neutrophils Absolute: 4.8 10*3/uL (ref 1.4–7.0)
Neutrophils: 66 %
Platelets: 181 10*3/uL (ref 150–450)
RBC: 4.95 x10E6/uL (ref 3.77–5.28)
RDW: 12.3 % (ref 11.7–15.4)
WBC: 7.2 10*3/uL (ref 3.4–10.8)

## 2019-01-26 LAB — BASIC METABOLIC PANEL
BUN/Creatinine Ratio: 11 (ref 9–23)
BUN: 11 mg/dL (ref 6–24)
CO2: 22 mmol/L (ref 20–29)
Calcium: 8.8 mg/dL (ref 8.7–10.2)
Chloride: 103 mmol/L (ref 96–106)
Creatinine, Ser: 0.99 mg/dL (ref 0.57–1.00)
GFR calc Af Amer: 80 mL/min/{1.73_m2} (ref >59)
GFR calc non Af Amer: 69 mL/min/{1.73_m2} (ref >59)
Glucose: 107 mg/dL — ABNORMAL HIGH (ref 65–99)
Potassium: 3.8 mmol/L (ref 3.5–5.2)
Sodium: 142 mmol/L (ref 134–144)

## 2019-01-26 NOTE — Telephone Encounter (Signed)
See my previous note: once a day - it comes as a 325 mg tablet

## 2019-01-26 NOTE — Telephone Encounter (Signed)
-----   Message from Levert Feinstein, MD sent at 01/26/2019  8:55 AM EST ----- Please call pt: her Hb is normal at 15 so she is not losing much blood from her period. I would start an iron supplement once a day ferrous sulfate. I would not advise having an endometrial ablation at this time.  She can decrease her Xarelto dose to 10 mg daily in May.

## 2019-01-26 NOTE — Telephone Encounter (Signed)
She wants to know how much and how often?

## 2019-01-26 NOTE — Telephone Encounter (Signed)
Ferrous sulfate is an OTC med!

## 2019-01-26 NOTE — Telephone Encounter (Signed)
Pt called / informed "Hb is normal at 15 so she is not losing much blood from her period. I would start an iron supplement once a day ferrous sulfate. I would not advise having an endometrial ablation at this time. She can decrease her Xarelto dose to 10 mg daily in May. " per Dr Cyndie Chime. Voiced understanding. Requested ferrous sulfate to be sent her pharmacy.

## 2019-01-27 NOTE — Telephone Encounter (Signed)
Called pt - no answer; left message on self identified  vm " to take ferrous sulfate, once a day - it comes as a 325 mg tablet, which she can buy over the counter". And may call for any questions.

## 2019-01-31 ENCOUNTER — Encounter: Payer: Self-pay | Admitting: Oncology

## 2019-03-12 ENCOUNTER — Other Ambulatory Visit: Payer: Self-pay | Admitting: Oncology

## 2019-03-24 ENCOUNTER — Telehealth: Payer: Self-pay | Admitting: Oncology

## 2019-03-24 ENCOUNTER — Encounter: Payer: Self-pay | Admitting: Oncology

## 2019-03-24 NOTE — Telephone Encounter (Signed)
A new patient appt has been scheduled for the pt to see Dr. Truett Perna on 7/21 at 1230pm. Letter mailed.

## 2019-05-04 ENCOUNTER — Telehealth: Payer: Self-pay | Admitting: Internal Medicine

## 2019-05-04 NOTE — Telephone Encounter (Signed)
New Message:    Patient calling needing a appt. Please call patient.

## 2019-05-06 ENCOUNTER — Telehealth: Payer: Self-pay

## 2019-05-06 NOTE — Telephone Encounter (Signed)
Spoke with pt regarding appt on 05/09/19. Pt stated she can not check vitals prior to appt. Pt concerns were address.

## 2019-05-06 NOTE — Telephone Encounter (Signed)
  Patient tried to return call to number provided but call wouldn't go through. Please call back

## 2019-05-09 ENCOUNTER — Telehealth (INDEPENDENT_AMBULATORY_CARE_PROVIDER_SITE_OTHER): Payer: 59 | Admitting: Internal Medicine

## 2019-05-09 VITALS — BP 113/76 | HR 89 | Ht 68.0 in | Wt 168.0 lb

## 2019-05-09 DIAGNOSIS — R002 Palpitations: Secondary | ICD-10-CM

## 2019-05-09 DIAGNOSIS — I491 Atrial premature depolarization: Secondary | ICD-10-CM

## 2019-05-09 DIAGNOSIS — R000 Tachycardia, unspecified: Secondary | ICD-10-CM

## 2019-05-09 NOTE — Progress Notes (Signed)
Electrophysiology TeleHealth Note   Due to national recommendations of social distancing due to COVID 19, an audio  telehealth visit is felt to be most appropriate for this patient at this time.  See MyChart message from today for the patient's consent to telehealth for [name].  We had some trouble getting connected virtually. A telephone visit is therefore performed today.   Date:  05/09/2019   ID:  Susan, Roach 11-08-1971, MRN [id number]  Location: patient's home  Provider location: [address] [place]  Evaluation Performed: Follow-up visit  PCP:  Patient, [name] Per  Electrophysiologist:  Dr Johney Frame  Chief Complaint:  palpitations  History of Present Illness:    [name] is a 48 y.o. female who presents via audio conferencing for a telehealth visit today.  Since last being seen in our clinic, the patient reports doing reasonably well.  She reports that she was diagnosed with DVT in her L leg.  She has been followed by Dr [name] since that time.  She has been treated with xarelto. She is scheduled to see Dr [name] now that Dr [name] has retired.    Today, she denies symptoms of exertional chest pain, shortness of breath,  lower extremity edema, dizziness, presyncope, or syncope.  She has occasional "fluttering" and chest heaviness.   She does not have any chest pain with exertion.  She exercises regularly.  The patient is otherwise without complaint today.  The patient denies symptoms of fevers, chills, cough, or new SOB worrisome for COVID 19.  Past Medical History:  Diagnosis Date  . Anxiety   . Exercise tolerance test normal    09-03-2016 no ischemia, low risk study  . Hiatal hernia   . History of abnormal cervical Pap smear    2014  s/p  LEEP  . History of DVT (deep vein thrombosis)    10/ 2013 dx post partum day 18 (following SVD) w/ extensive proximal iliopelvic DVT, left common femoral, profunda, popliteal, posterior  tibial, peroneal, and saphenofermoral junction arteries  s/p  transcatheter thrombolysis w/ angioplasty and stent to left common iliac high grade stenosis  . 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 Seasonal allergies   . Sinus tachycardia    intermittant symptoms since 2002 gestational--- extensive evaluation by dr [name] at [facility] in 2003    Past Surgical History:  Procedure Laterality Date  . EVALUATION UNDER ANESTHESIA WITH ANAL FISTULECTOMY N/A 01/16/2017   Procedure: EXAM UNDER ANESTHESIA WITH POSSIBLE FISTULOTOMY, POSSIBLE SETON;  Surgeon: Susan Levee, MD;  Location: [facility] ;  Service: General;  Laterality: N/A;  . TRANSCATHETER VENOUS INFUSION THROMBOLYSIS/  MECHANICAL THROMBECTOMY LEFT ILIOFEMORAL OCCLUSIVE THROMBUS/  STENT-ASSISTED BALLOON ANGIOPLASTY HIGH GRADE LEFT COMMON ILIAC VEIN STENOSIS  10/18/2012   findings consistant w/ May-Thurner Syndrome  . TRANSTHORACIC ECHOCARDIOGRAM  05/17/2012   EF 60-65%/  trivial MR  . WISDOM TOOTH EXTRACTION      Current Outpatient Medications  Medication Sig Dispense Refill  . ALPRAZolam (XANAX) 0.5 MG tablet Take 0.5 mg by mouth every 8 (eight) hours as needed for anxiety.   0  . Ascorbic Acid (VITAMIN C) 1000 MG tablet Take 1,000 mg by mouth daily.    Marland Kitchen atenolol (TENORMIN) 25 MG tablet Take 1 tablet (25 mg total) by mouth daily. 90 tablet 3  . cetirizine (ZYRTEC) 10  MG tablet Take 10 mg by mouth daily.    . diphenhydrAMINE (BENADRYL) 25 mg capsule Take 25 mg by mouth as needed for allergies.     Carlena Hurl. XARELTO 20 MG TABS tablet TAKE 1 TABLET BY MOUTH EVERY DAY 30 tablet 3   No current facility-administered medications for this visit.     Allergies:   Azithromycin; Metoprolol; Moxifloxacin; Avelox [moxifloxacin hcl in nacl]; and Moxifloxacin hcl   Social History:  The patient  reports  that she has never smoked. She has never used smokeless tobacco. She reports that she does not drink alcohol or use drugs.   Family History:  The patient's  family history includes Diabetes in her father; Diabetes type II in her brother; Healthy in her son, son, and son; Heart attack in her father and maternal grandfather; Hypertension in her brother and father.   ROS:  Please see the history of present illness.   All other systems are personally reviewed and negative.    Exam:    Vital Signs:  BP 113/76   Pulse 89   Ht 5\' 8"  (1.727 m)   Wt 168 lb (76.2 kg)   BMI 25.54 kg/m   Well appearing, alert and conversant, regular work of breathing,  good skin color Eyes- anicteric, neuro- grossly intact, skin- no apparent rash or lesions or cyanosis, mouth- oral mucosa is pink  Echo 08/02/2018 normal   Labs/Other Tests and Data Reviewed:    Recent Labs: 11/18/2018: ALT 21 11/29/2018: TSH 0.785 01/25/2019: BUN 11; Creatinine, Ser 0.99; Hemoglobin 15.1; Platelets 181; Potassium 3.8; Sodium 142   Wt Readings from Last 3 Encounters:  05/09/19 168 lb (76.2 kg)  01/25/19 180 lb 12.8 oz (82 kg)  11/29/18 179 lb 14.4 oz (81.6 kg)     Other studies personally reviewed: Additional studies/ records that were reviewed today include: prior echo,  Dr [name]'s notes  Review of the above records today demonstrates: as above   ASSESSMENT & PLAN:    1.  Palpitations Chronic, typically due to PACs Continue atenolol Lifestyle modification encouarged  2. Recurrent DVT Followed by hematology  3. COVID 19 screen The patient denies symptoms of COVID 19 at this time.  The importance of social distancing was discussed today.  Follow-up:  EP PA in 6 months  Current medicines are reviewed at length with the patient today.   The patient does not have concerns regarding her medicines.  The following changes were made today:  none  Labs/ tests ordered today include:  No orders of the defined  types were placed in this encounter.  Patient Risk:  after full review of this patients clinical status, I feel that they are at moderate risk at this time.  Today, I have spent 22 minutes with the patient with telehealth technology discussing palpitations .    Randolm IdolSigned, Gerber Penza, MD  05/09/2019 12:12 PM     [facility] [address] [place] [address] [phone] (office) [phone] (fax)

## 2019-06-06 ENCOUNTER — Telehealth: Payer: Self-pay | Admitting: Oncology

## 2019-06-06 NOTE — Telephone Encounter (Signed)
Pt cld wanting to r/s hem appt to an earlier date. Pt has been r/s to 7/9 at 930am.

## 2019-07-06 ENCOUNTER — Telehealth: Payer: Self-pay | Admitting: Oncology

## 2019-07-06 NOTE — Telephone Encounter (Signed)
Called patient to reschedule new patient appointment per [facility] - former [place] paitient  Per patient she no longer lives locally, had made arrangements for the appointment tomorrow and has already made to trip up from [place]. Patient is asking to keep appointment as scheduled - rescheduling is not going to be convenient for her. Appointment scheduled back on 6/8. Message to [facility]/desk nurse.

## 2019-07-07 ENCOUNTER — Telehealth: Payer: Self-pay | Admitting: *Deleted

## 2019-07-07 ENCOUNTER — Inpatient Hospital Stay: Payer: 59 | Attending: Oncology | Admitting: Oncology

## 2019-07-07 ENCOUNTER — Inpatient Hospital Stay: Payer: 59

## 2019-07-07 ENCOUNTER — Other Ambulatory Visit: Payer: Self-pay

## 2019-07-07 VITALS — BP 131/75 | HR 82 | Temp 97.8°F | Resp 18 | Ht 68.0 in | Wt 166.6 lb

## 2019-07-07 DIAGNOSIS — Z86718 Personal history of other venous thrombosis and embolism: Secondary | ICD-10-CM | POA: Insufficient documentation

## 2019-07-07 DIAGNOSIS — I82462 Acute embolism and thrombosis of left calf muscular vein: Secondary | ICD-10-CM | POA: Insufficient documentation

## 2019-07-07 DIAGNOSIS — Z701 Counseling related to patient's sexual behavior and orientation: Secondary | ICD-10-CM | POA: Insufficient documentation

## 2019-07-07 DIAGNOSIS — Z7901 Long term (current) use of anticoagulants: Secondary | ICD-10-CM

## 2019-07-07 DIAGNOSIS — N920 Excessive and frequent menstruation with regular cycle: Secondary | ICD-10-CM | POA: Diagnosis not present

## 2019-07-07 DIAGNOSIS — I871 Compression of vein: Secondary | ICD-10-CM

## 2019-07-07 LAB — CBC WITH DIFFERENTIAL (CANCER CENTER ONLY)
Abs Immature Granulocytes: 0.01 10*3/uL (ref 0.00–0.07)
Basophils Absolute: 0 10*3/uL (ref 0.0–0.1)
Basophils Relative: 0 %
Eosinophils Absolute: 0.1 10*3/uL (ref 0.0–0.5)
Eosinophils Relative: 1 %
HCT: 45 % (ref 36.0–46.0)
Hemoglobin: 15.1 g/dL — ABNORMAL HIGH (ref 12.0–15.0)
Immature Granulocytes: 0 %
Lymphocytes Relative: 36 %
Lymphs Abs: 1.9 10*3/uL (ref 0.7–4.0)
MCH: 30 pg (ref 26.0–34.0)
MCHC: 33.6 g/dL (ref 30.0–36.0)
MCV: 89.5 fL (ref 80.0–100.0)
Monocytes Absolute: 0.4 10*3/uL (ref 0.1–1.0)
Monocytes Relative: 7 %
Neutro Abs: 2.8 10*3/uL (ref 1.7–7.7)
Neutrophils Relative %: 56 %
Platelet Count: 141 10*3/uL — ABNORMAL LOW (ref 150–400)
RBC: 5.03 MIL/uL (ref 3.87–5.11)
RDW: 12.1 % (ref 11.5–15.5)
WBC Count: 5.2 10*3/uL (ref 4.0–10.5)
nRBC: 0 % (ref 0.0–0.2)

## 2019-07-07 NOTE — Telephone Encounter (Signed)
TCT patient regarding results from today's CBC. Spoke with patient. Advised that her HGB is normal and that her paltelets are at the the low end of normal per Dr. Benay Spice. Advised to call back if any new bleeding, excessive bruising. Pt voiced understanding. She is aware of 6 month follow up with Dr. Benay Spice.

## 2019-07-07 NOTE — Telephone Encounter (Signed)
-----   Message from Ladell Pier, MD sent at 07/07/2019  2:05 PM EDT ----- Please call patient, hemoglobin is normal, platelets at low end of normal range- chronic, call for bleeding, follow-up as scheduled

## 2019-07-07 NOTE — Progress Notes (Signed)
[name]   Requesting MD: [name], Susan M, Md No address on file   [name] 48 y.o.  [birth year]    Reason for Roach: Recurrent venous thrombosis, anticoagulation therapy   HPI: Ms. Susan Normanran was diagnosed with a postpartum left lower extremity deep vein thrombosis in October 2013.  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  She underwent thrombectomy/thrombolysis and placement of a left iliac stent.  She was treated with 6 months of Xarelto anticoagulation.  Dr. Cyndie Roach reports a hypercoagulation evaluation was unremarkable.  She had pain in the left leg in November 2019.  A Doppler was negative, but the d-dimer was elevated.  A repeat Doppler confirmed a left gastrocnemius vein thrombosis.  Anticoagulation therapy was resumed with Xarelto.  She continues Xarelto.  She has noted menorrhagia since beginning Xarelto.  She takes a progesterone for the first week of the menstrual cycle.  This has resulted in improvement in the menorrhagia.  No other bleeding.  No symptoms of recurrent thrombosis.  Past Medical History:  Diagnosis Date  . Anxiety   . Exercise tolerance test normal    09-03-2016 no ischemia, low risk study  . Hiatal hernia   . History of abnormal cervical Pap smear    2014  s/p  LEEP  . History of DVT (deep vein thrombosis)    10/ 2013 dx post partum day 18 (following SVD) w/ extensive proximal iliopelvic DVT, left common femoral, profunda, popliteal, posterior tibial, peroneal, and saphenofermoral junction arteries  s/p  transcatheter thrombolysis w/ angioplasty and stent to left common iliac high grade stenosis  . 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 Seasonal allergies   . Sinus tachycardia    intermittant symptoms since 2002 gestational--- extensive evaluation [name]    .  G3, P3   .  History of migraine headaches  Past Surgical History:  Procedure Laterality Date  . EVALUATION UNDER ANESTHESIA WITH ANAL FISTULECTOMY N/A 01/16/2017   Procedure: EXAM UNDER ANESTHESIA WITH POSSIBLE FISTULOTOMY, POSSIBLE SETON;  Surgeon: Susan LeveeAlicia Thomas, MD;  [facility];  Service: General;  Laterality: N/A;  . TRANSCATHETER VENOUS INFUSION THROMBOLYSIS/  MECHANICAL THROMBECTOMY LEFT ILIOFEMORAL OCCLUSIVE THROMBUS/  STENT-ASSISTED BALLOON ANGIOPLASTY HIGH GRADE LEFT COMMON ILIAC VEIN STENOSIS  10/18/2012   findings consistant w/ May-Thurner Syndrome  . TRANSTHORACIC ECHOCARDIOGRAM  05/17/2012   EF 60-65%/  trivial MR  . WISDOM TOOTH EXTRACTION      Medications: Reviewed  Allergies:  Allergies  Allergen Reactions  . Azithromycin Hives  . Metoprolol Hives  . Moxifloxacin Hives  . Avelox [Moxifloxacin Hcl In Nacl] Hives  . Moxifloxacin Hcl Hives    Family history: Her maternal grandmother had colon cancer.  No family history of venous thrombosis.  Her father died of a myocardial infarction.  Social History:   She lives with her husband and child.  She lives part-time in [place] and the majority of the time in [place].  She works in Research officer, political partyreal estate.  She does not use cigarettes or alcohol.  No risk factor for HIV or hepatitis.  ROS:   Positives include: Tachycardia with exertion, migraine headache in November 2019 prompting an emergency room visit in [place], periodic "hives "relieved when she takes daily Zyrtec.  She has noted a fullness at the lateral right lower leg for the  past few months  A complete ROS was otherwise negative.  Physical Exam:  Blood pressure 131/75, pulse 82, temperature 97.8 F (36.6 C), temperature source Oral, resp. rate 18, height 5\' 8"  (1.727 Roach), weight 166 lb 9.6 oz (75.6 kg), SpO2 100 %.  Limited physical examination secondary to distancing with the COVID pandemic Vascular: The  legs are symmetric, no erythema or palpable cord, at the distal lateral right lower leg there is an area of soft fullness over several centimeters-no tenderness    LAB:  CBC  Lab Results  Component Value Date   WBC 5.2 07/07/2019   HGB 15.1 (H) 07/07/2019   HCT 45.0 07/07/2019   MCV 89.5 07/07/2019   PLT 141 (L) 07/07/2019   NEUTROABS 2.8 07/07/2019        CMP  Lab Results  Component Value Date   NA 142 01/25/2019   K 3.8 01/25/2019   CL 103 01/25/2019   CO2 22 01/25/2019   GLUCOSE 107 (H) 01/25/2019   BUN 11 01/25/2019   CREATININE 0.99 01/25/2019   CALCIUM 8.8 01/25/2019   PROT 6.6 11/18/2018   ALBUMIN 3.8 11/18/2018   AST 21 11/18/2018   ALT 21 11/18/2018   ALKPHOS 46 11/18/2018   BILITOT 0.8 11/18/2018   GFRNONAA 69 01/25/2019   GFRAA 80 01/25/2019       Assessment/Plan:   1. 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 Menorrhagia 3. History of tachycardia 4. G3, P3 5. Allergies 6. Anxiety 7. Abnormal Pap smear 2014, status post LEEP   Disposition:   [name] is maintained on indefinite anticoagulation therapy after having recurrent left lower extremity deep vein thrombosis.  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 She is now maintained on chronic anticoagulation therapy after developing a recurrent left leg DVT in November 2019.  I suspect the area of soft fullness at the lateral right lower leg is a varicose vein.  She will contact us if this area changes.  She has menorrhagia, but the hemoglobin is normal today.  I recommend  continuing Xarelto anticoagulation.  Dr. Beryle Roach recommends decreasing the Xarelto dose.  I will discuss this further with my colleagues.  She would like to continue follow-up in the hematology clinic.  She will return for an office visit in 6 months.  Betsy Coder, MD  07/07/2019, 1:35 PM

## 2019-07-08 ENCOUNTER — Telehealth: Payer: Self-pay | Admitting: Oncology

## 2019-07-08 ENCOUNTER — Encounter: Payer: Self-pay | Admitting: General Practice

## 2019-07-08 NOTE — Telephone Encounter (Signed)
Called and spoke with patient. Confirmed date and time  °

## 2019-07-08 NOTE — Progress Notes (Signed)
[place] CSW Progress Notes   Referral received from [place], asked CSW to call patient to discuss options for addressing medical related anxiety.  Called patient.  Has current diagnosis of blood clotting disorder - was initially found during ED visit approx one year ago.  Initial symptoms found in ED were originally thought to be less severe; however, patient ended up being directly admitted to ICU for treatment.  Struggles to know what to pay attention to in terms of bodily symptoms, has difficulty assessing severity of symptoms, especially those which are not clearly needing attention vs "watchful waiting."  Anxiety presents as both ongoing and pervasive low level anxiety with occasional flares of heightened anxiety.  Has difficulty assessing severity of symptoms and feeling confident in self management.  Discussed natural history of anxiety, various ways to cope both w physical strategies and cognitive strategies.  Sent information on options for yoga, tai chi, tapping, and "in the moment" anxiety strategies.  Does work w Engineering geologist Santiago Glad [place] at [place]) and will reconnect with her next week after therapist returns from summer vacation.  Current pandemic restrictions and anxiety have heightened patient's distress due to multiple changes in behavior required and general (udnerstandable) uncertainty about future state of life as pandemic continues.  Encouraged patient to reach out to MD's desk RN to discuss need for further referrals and/or how to evaluate and make decisions about severity of any given symptom she encounters.  Of note, patient does not have a current PCP thus depends on various specialists for medical advice/care - encouraged her to find PCP she trusts in order to have this baseline level of care covered.  Does use PRN Xanax prescribed approx weekly to deal w more overwhelming anxiety, walks daily, encouraged to pursue regularmindfulness practice in order to increase reliance on  nonpharmacological anxiety control and decrease use of anxiolytics.  Edwyna Shell, LCSW Clinical Social Worker Phone:  [phone]

## 2019-07-11 ENCOUNTER — Telehealth: Payer: Self-pay | Admitting: *Deleted

## 2019-07-11 NOTE — Telephone Encounter (Addendum)
Patient reporting extreme anxiety and concern about the area on her LLE of soft fullness. Became more anxious when her massage therapist told her "you need to have that looked at". She is requesting a repeat US or to have another physical look at it as a 2nd opinion. She is willing to go to [facility], [facility] or [facility] for the exam. Per Dr. Benay Spice: Does not think another [place] is necessary. Can refer to [facility] if she would like a 2nd opinion. Left VM for her to call back with her decision.

## 2019-07-14 ENCOUNTER — Encounter: Payer: Self-pay | Admitting: *Deleted

## 2019-07-19 ENCOUNTER — Encounter: Payer: 59 | Admitting: Oncology

## 2019-07-21 ENCOUNTER — Other Ambulatory Visit: Payer: Self-pay | Admitting: *Deleted

## 2019-07-21 DIAGNOSIS — I82562 Chronic embolism and thrombosis of left calf muscular vein: Secondary | ICD-10-CM

## 2019-07-21 MED ORDER — RIVAROXABAN 20 MG PO TABS: 20.0000 mg | ORAL_TABLET | Freq: Every day | ORAL | 3 refills | Status: DC

## 2019-07-21 NOTE — Progress Notes (Signed)
Patient requesting refill on Xarelto and wants 90 day supply. OK per Dr. Benay Spice.

## 2019-07-21 NOTE — Progress Notes (Signed)
Patient confirms she wants referral to [facility]. Referral ordered and records faxed.

## 2019-08-29 ENCOUNTER — Other Ambulatory Visit: Payer: Self-pay

## 2019-08-29 DIAGNOSIS — I824Z9 Acute embolism and thrombosis of unspecified deep veins of unspecified distal lower extremity: Secondary | ICD-10-CM

## 2019-09-01 ENCOUNTER — Encounter: Payer: 59 | Admitting: Vascular Surgery

## 2019-09-01 ENCOUNTER — Inpatient Hospital Stay (HOSPITAL_COMMUNITY): Admission: RE | Admit: 2019-09-01 | Payer: 59 | Source: Ambulatory Visit

## 2019-09-22 ENCOUNTER — Encounter (HOSPITAL_COMMUNITY): Payer: 59

## 2019-09-22 ENCOUNTER — Encounter: Payer: 59 | Admitting: Vascular Surgery

## 2019-09-27 ENCOUNTER — Other Ambulatory Visit: Payer: Self-pay

## 2019-09-27 DIAGNOSIS — I82562 Chronic embolism and thrombosis of left calf muscular vein: Secondary | ICD-10-CM

## 2019-09-28 ENCOUNTER — Other Ambulatory Visit: Payer: Self-pay | Admitting: Vascular Surgery

## 2019-09-28 DIAGNOSIS — I82562 Chronic embolism and thrombosis of left calf muscular vein: Secondary | ICD-10-CM

## 2019-09-28 DIAGNOSIS — I83899 Varicose veins of unspecified lower extremities with other complications: Secondary | ICD-10-CM

## 2019-09-29 ENCOUNTER — Ambulatory Visit (INDEPENDENT_AMBULATORY_CARE_PROVIDER_SITE_OTHER): Payer: 59 | Admitting: Vascular Surgery

## 2019-09-29 ENCOUNTER — Ambulatory Visit (HOSPITAL_COMMUNITY)
Admission: RE | Admit: 2019-09-29 | Discharge: 2019-09-29 | Disposition: A | Discharge status: Home / Self Care | Payer: 59 | Source: Ambulatory Visit | Attending: Vascular Surgery | Admitting: Vascular Surgery

## 2019-09-29 ENCOUNTER — Encounter: Payer: Self-pay | Admitting: Vascular Surgery

## 2019-09-29 ENCOUNTER — Other Ambulatory Visit: Payer: Self-pay

## 2019-09-29 VITALS — BP 116/73 | HR 90 | Temp 97.5°F | Resp 20 | Ht 68.0 in | Wt 163.6 lb

## 2019-09-29 DIAGNOSIS — I82562 Chronic embolism and thrombosis of left calf muscular vein: Secondary | ICD-10-CM | POA: Insufficient documentation

## 2019-09-29 DIAGNOSIS — I83813 Varicose veins of bilateral lower extremities with pain: Secondary | ICD-10-CM

## 2019-09-29 DIAGNOSIS — I83899 Varicose veins of unspecified lower extremities with other complications: Secondary | ICD-10-CM | POA: Diagnosis not present

## 2019-09-29 NOTE — Progress Notes (Signed)
Referring Physician: Dr. 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 female, who previously had an iliofemoral DVT in 2013 associated with pregnancy.  She had a left common iliac stent placed at that point by interventional radiology.  She was placed on long-term anticoagulation.  Her anticoagulation was eventually discontinued.  She then developed some recurrent symptoms of pain and swelling in the left leg.  Duplex ultrasound was performed which showed a gastrocnemius vein clot.  She was then placed back on anticoagulation and continues this currently.  She has difficulties related to ongoing vaginal bleeding and heavy periods thought to be secondary to her anticoagulation.  She is on progesterone hormone supplementation.  She has never had a pulmonary embolus.  Recent scan of her iliac vein stent showed it was patent.  She recently noticed a bulge on the lateral aspect of her right ankle and this was thought to possibly be a varicose vein.  Patient is very anxious about having recurrent DVT or pulmonary embolus or even death.  She was very tearful during the office visit today.  She really has no other significant medical problems.  She is currently seeing a counselor to deal with her anxiety issues.  She has had occasional episodes of pain and swelling in her left leg chronically since her extensive DVT.  Past Medical History:  Diagnosis Date  . Anxiety   . DVT (deep venous thrombosis) (HCC)   . Exercise tolerance test normal    09-03-2016 no ischemia, low risk study  . Hiatal hernia   . History of abnormal cervical Pap smear    2014  s/p  LEEP  . History of DVT (deep vein thrombosis)    10/ 2013 dx post partum day 18 (following SVD) w/ extensive proximal iliopelvic DVT, left common femoral, profunda, popliteal, posterior tibial, peroneal, and  saphenofermoral junction arteries  s/p  transcatheter thrombolysis w/ angioplasty and stent to left common iliac high grade stenosis  . 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 Seasonal allergies   . Sinus tachycardia    intermittant symptoms since 2002 gestational--- extensive evaluation by dr Macon Large at [facility] in 2003   Past Surgical History:  Procedure Laterality Date  . EVALUATION UNDER ANESTHESIA WITH ANAL FISTULECTOMY N/A 01/16/2017   Procedure: EXAM UNDER ANESTHESIA WITH POSSIBLE FISTULOTOMY, POSSIBLE SETON;  Surgeon: Romie Levee, MD;  [facility];  Service: General;  Laterality: N/A;  . TRANSCATHETER VENOUS INFUSION THROMBOLYSIS/  MECHANICAL THROMBECTOMY LEFT ILIOFEMORAL OCCLUSIVE THROMBUS/  STENT-ASSISTED BALLOON ANGIOPLASTY HIGH GRADE LEFT COMMON ILIAC VEIN STENOSIS  10/18/2012   findings consistant w/ May-Thurner Syndrome  . TRANSTHORACIC ECHOCARDIOGRAM  05/17/2012   EF 60-65%/  trivial MR  . WISDOM TOOTH EXTRACTION      Family History  Problem Relation Age of Onset  . Diabetes type II Brother   . Hypertension Brother   . Heart attack Father   . Diabetes Father   . Hypertension Father   . Heart attack Maternal Grandfather   . Healthy Son   . Healthy Son   . Healthy Son     SOCIAL HISTORY: Social History   Socioeconomic History  . Marital status: Married    Spouse name: Not on file  .  Number of children: Not on file  . Years of education: Not on file  . Highest education level: Not on file  Occupational History  . Not on file  Social Needs  . Financial resource strain: Not on file  . Food insecurity    Worry: Not on file    Inability: Not on file  . Transportation needs    Medical: Not on file    Non-medical: Not on file  Tobacco Use  . Smoking status: Never Smoker  . Smokeless tobacco: Never Used   Substance and Sexual Activity  . Alcohol use: No  . Drug use: No  . Sexual activity: Not on file  Lifestyle  . Physical activity    Days per week: Not on file    Minutes per session: Not on file  . Stress: Not on file  Relationships  . Social Musician on phone: Not on file    Gets together: Not on file    Attends religious service: Not on file    Active member of club or organization: Not on file    Attends meetings of clubs or organizations: Not on file    Relationship status: Not on file  . Intimate partner violence    Fear of current or ex partner: Not on file    Emotionally abused: Not on file    Physically abused: Not on file    Forced sexual activity: Not on file  Other Topics Concern  . 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    Allergies  Allergen Reactions  . Azithromycin Hives  . Metoprolol Hives  . Moxifloxacin Hives  . Avelox [Moxifloxacin Hcl In Nacl] Hives  . Moxifloxacin Hcl Hives    Current Outpatient Medications  Medication Sig Dispense Refill  . ALPRAZolam (XANAX) 0.5 MG tablet Take 0.5 mg by mouth every 8 (eight) hours as needed for anxiety.   0  . Ascorbic Acid (VITAMIN C) 1000 MG tablet Take 1,000 mg by mouth daily.    Marland Kitchen atenolol (TENORMIN) 25 MG tablet Take 1 tablet (25 mg total) by mouth daily. 90 tablet 3  . cetirizine (ZYRTEC) 10 MG tablet Take 10 mg by mouth daily.    . diphenhydrAMINE (BENADRYL) 25 mg capsule Take 25 mg by mouth as needed for allergies.     . medroxyPROGESTERone (PROVERA) 10 MG tablet Take 10 mg by mouth daily. Takes 1st 7 days of cycle    . rivaroxaban (XARELTO) 20 MG TABS tablet Take 1 tablet (20 mg total) by mouth daily. 90 tablet 3   No current facility-administered medications for this visit.     ROS:   General:  No weight loss, Fever, chills  HEENT: No recent headaches, no nasal bleeding, no visual changes, no sore throat  Neurologic: No dizziness,  blackouts, seizures. No recent symptoms of stroke or mini- stroke. No recent episodes of slurred speech, or temporary blindness.  Cardiac: No recent episodes of chest pain/pressure, no shortness of breath at rest.  No shortness of breath with exertion.  Denies history of atrial fibrillation or irregular heartbeat  Vascular: No history of rest pain in feet.  No history of claudication.  No history of non-healing ulcer, No history of DVT   Pulmonary: No home oxygen, no productive cough, no hemoptysis,  No asthma or wheezing  Musculoskeletal:   Arthritis,  Low back pain,   Joint pain  Hematologic:No history of hypercoagulable state.  No history of easy bleeding.  No history of anemia  Gastrointestinal: No hematochezia or melena,  No gastroesophageal reflux, no trouble swallowing  Urinary: [ ]  chronic Kidney disease, [ ]  on HD - [ ]  MWF or [ ]  TTHS, [ ]  Burning with urination, [ ]  Frequent urination, [ ]  Difficulty urinating;   Skin: No rashes  Psychological: No history of anxiety,  No history of depression   Physical Examination  Vitals:   09/29/19 1436  BP: 116/73  Pulse: 90  Resp: 20  Temp: (!) 97.5 F (36.4 C)  SpO2: 100%  Weight: 163 lb 9.6 oz (74.2 kg)  Height: 5\' 8"  (1.727 m)    Body mass index is 24.88 kg/m.  General:  Alert and oriented, no acute distress HEENT: Normal Neck: No JVD Cardiac: Regular Rate and Rhythm  Skin: No rash, no ulcer no obvious varicosity Extremity Pulses:  2+ radial, brachial, femoral, dorsalis pedis, posterior tibial pulses bilaterally Musculoskeletal: No deformity or edema, she does have a slight change in contour about 5 cm above the lateral aspect of her right ankle.  This does not feel like a palpable varicose vein to me.  Neurologic: Upper and lower extremity motor 5/5 and symmetric  DATA:  Patient had a venous duplex today for reflux.  She was noted to have common femoral vein reflux bilaterally.  The greater saphenous vein  also had diffuse reflux bilaterally.  However the vein was not significantly dilated only about 3 mm.  There was no evidence of deep or superficial venous thrombosis.  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  She currently has fairly heavy periods and ongoing vaginal bleeding most likely secondary to her anticoagulation.  Her prior DVT history is of an iliofemoral DVT associated with pregnancy.  She then had a subsequent gastrocnemius clot.  She did not have any evidence of proximal DVT with this recent episode.  She has never had a pulmonary embolus.  She apparently has had a hypercoagulable work-up in the past by Dr. Cyndie ChimeGranfortuna which was negative.  She does have evidence of venous reflux in her superficial venous system but not significant dilation.   PLAN: #1 as far as her chronic long-term anticoagulation is concerned, I am not sure that she really needs to be committed to a life of long-term anticoagulation with a provoked DVT at her first episode.  Although the second episode seems unprovoked this was thrombus in her gastrocnemius vein which again would be low risk and there was no propagation into a more proximal vein.  I discussed this with the patient today.  And told her that she needs to have further ongoing discussions with Dr. Elnita Maxwellheryl regarding whether or not she needs lifelong anticoagulation or stopping the anticoagulation at this time to be rid of the nuisance vaginal bleeding.  2.  Superficial venous reflux with no obvious visible varicosities and really asymptomatic at this point.  I would not consider laser ablation.  I did discuss with her wearing  lower extremity compression stockings to prevent worsening reflux and limb swelling over time.  I also told her this would also reduce  her risk of DVT especially on long trips.  She will try to get knee-high 20 to 30 mm compression stockings in the near future.  She will follow-up with me on as-needed basis.   Ruta Hinds, MD [facility] Office: [phone] Pager: [phone]

## 2019-10-17 ENCOUNTER — Other Ambulatory Visit: Payer: Self-pay | Admitting: Internal Medicine

## 2020-01-05 ENCOUNTER — Telehealth: Payer: Self-pay | Admitting: Oncology

## 2020-01-05 NOTE — Telephone Encounter (Signed)
Returned patient's phone call regarding rescheduling an appointment, left a voicemail. 

## 2020-01-05 NOTE — Telephone Encounter (Signed)
Patient returned phone call regarding voicemail that was left, per patient's request 01/11 appointment has moved to 01/15.

## 2020-01-09 ENCOUNTER — Other Ambulatory Visit: Payer: 59

## 2020-01-09 ENCOUNTER — Ambulatory Visit: Payer: 59 | Admitting: Oncology

## 2020-01-13 ENCOUNTER — Ambulatory Visit (HOSPITAL_COMMUNITY)
Admission: RE | Admit: 2020-01-13 | Discharge: 2020-01-13 | Disposition: A | Discharge status: Home / Self Care | Payer: 59 | Source: Ambulatory Visit | Attending: Oncology | Admitting: Oncology

## 2020-01-13 ENCOUNTER — Telehealth: Payer: Self-pay | Admitting: *Deleted

## 2020-01-13 ENCOUNTER — Other Ambulatory Visit: Payer: Self-pay | Admitting: *Deleted

## 2020-01-13 ENCOUNTER — Other Ambulatory Visit: Payer: Self-pay

## 2020-01-13 ENCOUNTER — Inpatient Hospital Stay: Payer: 59 | Attending: Oncology

## 2020-01-13 ENCOUNTER — Inpatient Hospital Stay: Payer: 59

## 2020-01-13 ENCOUNTER — Inpatient Hospital Stay: Payer: 59 | Admitting: Oncology

## 2020-01-13 VITALS — BP 125/80 | HR 86 | Temp 97.8°F | Resp 18 | Ht 68.0 in | Wt 161.2 lb

## 2020-01-13 DIAGNOSIS — Z86718 Personal history of other venous thrombosis and embolism: Secondary | ICD-10-CM | POA: Diagnosis present

## 2020-01-13 DIAGNOSIS — R42 Dizziness and giddiness: Secondary | ICD-10-CM | POA: Insufficient documentation

## 2020-01-13 DIAGNOSIS — Z7901 Long term (current) use of anticoagulants: Secondary | ICD-10-CM | POA: Insufficient documentation

## 2020-01-13 DIAGNOSIS — N920 Excessive and frequent menstruation with regular cycle: Secondary | ICD-10-CM | POA: Insufficient documentation

## 2020-01-13 DIAGNOSIS — F419 Anxiety disorder, unspecified: Secondary | ICD-10-CM | POA: Diagnosis not present

## 2020-01-13 DIAGNOSIS — I82562 Chronic embolism and thrombosis of left calf muscular vein: Secondary | ICD-10-CM

## 2020-01-13 LAB — CBC WITH DIFFERENTIAL (CANCER CENTER ONLY)
Abs Immature Granulocytes: 0.02 10*3/uL (ref 0.00–0.07)
Basophils Absolute: 0 10*3/uL (ref 0.0–0.1)
Basophils Relative: 0 %
Eosinophils Absolute: 0.1 10*3/uL (ref 0.0–0.5)
Eosinophils Relative: 1 %
HCT: 43.5 % (ref 36.0–46.0)
Hemoglobin: 14.9 g/dL (ref 12.0–15.0)
Immature Granulocytes: 0 %
Lymphocytes Relative: 26 %
Lymphs Abs: 1.8 10*3/uL (ref 0.7–4.0)
MCH: 30.1 pg (ref 26.0–34.0)
MCHC: 34.3 g/dL (ref 30.0–36.0)
MCV: 87.9 fL (ref 80.0–100.0)
Monocytes Absolute: 0.4 10*3/uL (ref 0.1–1.0)
Monocytes Relative: 6 %
Neutro Abs: 4.6 10*3/uL (ref 1.7–7.7)
Neutrophils Relative %: 67 %
Platelet Count: 173 10*3/uL (ref 150–400)
RBC: 4.95 MIL/uL (ref 3.87–5.11)
RDW: 11.9 % (ref 11.5–15.5)
WBC Count: 6.9 10*3/uL (ref 4.0–10.5)
nRBC: 0 % (ref 0.0–0.2)

## 2020-01-13 LAB — D-DIMER, QUANTITATIVE: D-Dimer, Quant: 0.57 ug/mL-FEU — ABNORMAL HIGH (ref 0.00–0.50)

## 2020-01-13 NOTE — Progress Notes (Signed)
[facility] OFFICE PROGRESS NOTE   Diagnosis: Chronic anticoagulation  INTERVAL HISTORY:   [name] for a scheduled visit.  She continues rivaroxaban anticoagulation.  No bleeding other than heavy menses.  She plans to see her gynecologist today.  She is considering a procedure to halt her menses.  She saw Dr. Oneida Alar for evaluation of varicose veins.  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  He questioned the need for indefinite anticoagulation.  He did not recommend treatment of the varicosities.  She continues to travel between [place] in [place].  She walks for exercise.  She has noted discomfort at the left calf radiating toward the left posterior thigh for the past several weeks.  The pain is intermittent.  No erythema or increased leg swelling. She reports an episode of vertigo in October and continues to have intermittent vertigo symptoms. Objective:  Vital signs in last 24 hours:  Blood pressure 125/80, pulse 86, temperature 97.8 F (36.6 C), temperature source Temporal, resp. rate 18, height 5\' 8"  (1.727 m), weight 161 lb 3.2 oz (73.1 kg), SpO2 100 %.    Limited physical examination secondary to distancing with the Covid pandemic GI: No hepatosplenomegaly Vascular: The left leg is slightly larger than the right side with venous engorgement.  No erythema, tenderness, or palpable cord   Lab Results:  Lab Results  Component Value Date   WBC 6.9 01/13/2020   HGB 14.9 01/13/2020   HCT 43.5 01/13/2020   MCV 87.9 01/13/2020   PLT 173 01/13/2020   NEUTROABS 4.6 01/13/2020    CMP  Lab Results  Component Value Date   NA 142 01/25/2019   K 3.8 01/25/2019   CL 103 01/25/2019   CO2 22 01/25/2019   GLUCOSE 107 (H) 01/25/2019   BUN 11 01/25/2019   CREATININE 0.99 01/25/2019   CALCIUM 8.8 01/25/2019   PROT 6.6 11/18/2018   ALBUMIN 3.8 11/18/2018   AST 21 11/18/2018   ALT 21 11/18/2018   ALKPHOS 46 11/18/2018   BILITOT 0.8 11/18/2018   GFRNONAA 69  01/25/2019   GFRAA 80 01/25/2019     Medications: I have reviewed the patient's current medications.   Assessment/Plan: 1. 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 Menorrhagia 3. History of tachycardia 4. G3, P3 5. Allergies 6. Anxiety 7. Abnormal Pap smear 2014, status post LEEP    Disposition: [name] appears unchanged.  She is maintained on chronic anticoagulation after being diagnosed with 2 DVTs in the left leg.  We discussed the indication for continued anticoagulation and reduced intensity anticoagulation.  I recommend continued rivaroxaban at the current dose for now.  We will make referral to the coagulation service at [facility] to get their opinion.  I have a low clinical suspicion for a DVT today.  She will call for increased leg pain or swelling.  We checked a D-dimer and if elevated she will be referred for a Doppler of the left leg.  She is now greater than a year out from the most recent DVT.  I think it is okay to discontinue anticoagulation for a few days if she needs to have a gynecologic procedure.  She will return for an office visit in 6 months.  Betsy Coder,  MD  01/13/2020  8:16 AM

## 2020-01-13 NOTE — Progress Notes (Signed)
D. Dimer w/mild elevation. Due to some pain in LLE and mild swelling and H/O DVT, Dr. Truett Perna ordered to proceed with venous doppler of LLE today. Patient notified and she will stay in [place] till the venous duplex can be scheduled. Nothing available at [facility] or [facility].  [facility] center has opening at 2:45/:300 today and this was scheduled. Notified patient.

## 2020-01-13 NOTE — Telephone Encounter (Signed)
Call from [facility] that she was negative for DVT.  Per Dr. Truett Perna: continue xarelto, see local hematologist in [place] if pain/swelling worsens and follow through with referral to [facility]. She understands and agrees.

## 2020-01-16 ENCOUNTER — Telehealth: Payer: Self-pay | Admitting: *Deleted

## 2020-01-16 ENCOUNTER — Telehealth: Payer: Self-pay | Admitting: Oncology

## 2020-01-16 NOTE — Telephone Encounter (Signed)
Referral faxed to [facility] to Dr. Isaiah Serge - release [id number]

## 2020-01-16 NOTE — Telephone Encounter (Signed)
Scheduled per los. Called and left msg. Mailed printout  °

## 2020-02-21 ENCOUNTER — Ambulatory Visit: Payer: 59 | Attending: Internal Medicine

## 2020-02-21 DIAGNOSIS — Z20822 Contact with and (suspected) exposure to covid-19: Secondary | ICD-10-CM

## 2020-02-22 LAB — NOVEL CORONAVIRUS, NAA: SARS-CoV-2, NAA: NOT DETECTED

## 2020-02-27 ENCOUNTER — Encounter: Payer: Self-pay | Admitting: Oncology

## 2020-03-02 ENCOUNTER — Telehealth: Payer: Self-pay | Admitting: *Deleted

## 2020-03-02 NOTE — Telephone Encounter (Signed)
Dr. Truett Perna looked over all her labs from GYN and found the only abnormality is her Hgb is slightly elevated. Feels this is a benign finding and looking back she has done this on and off. Suggest repeating CBC/diff at her next appointment. Can recheck in 1-2 months if she will be in town. She agrees to email or call when she is coming to [place] to set up lab appointment. Inquired if she smoked and she states "no".

## 2020-04-19 ENCOUNTER — Telehealth: Payer: Self-pay | Admitting: *Deleted

## 2020-04-19 NOTE — Telephone Encounter (Signed)
Received notification from [facility] that recent fill of Xarelto was for [address] mg per Dr. Cherie Dark. Called her to clarify and she confirmed he reduced her dose to 10 mg daily. Record updated.

## 2020-05-11 ENCOUNTER — Other Ambulatory Visit: Payer: Self-pay | Admitting: Internal Medicine

## 2020-07-11 ENCOUNTER — Telehealth: Payer: Self-pay | Admitting: Oncology

## 2020-07-11 NOTE — Telephone Encounter (Signed)
Called pt per 7/14 sch msg - no answer. Left message for patient to call back to reschedule.

## 2020-07-12 ENCOUNTER — Inpatient Hospital Stay: Payer: 59 | Attending: Oncology | Admitting: Oncology

## 2020-08-24 ENCOUNTER — Other Ambulatory Visit: Payer: Self-pay | Admitting: Internal Medicine

## 2020-09-25 ENCOUNTER — Other Ambulatory Visit: Payer: Self-pay | Admitting: Obstetrics and Gynecology

## 2020-09-25 DIAGNOSIS — R928 Other abnormal and inconclusive findings on diagnostic imaging of breast: Secondary | ICD-10-CM

## 2020-10-02 ENCOUNTER — Other Ambulatory Visit: Payer: Self-pay | Admitting: Obstetrics and Gynecology

## 2020-10-02 DIAGNOSIS — N63 Unspecified lump in unspecified breast: Secondary | ICD-10-CM

## 2020-10-03 ENCOUNTER — Other Ambulatory Visit: Payer: Self-pay | Admitting: Obstetrics and Gynecology

## 2020-10-03 DIAGNOSIS — N63 Unspecified lump in unspecified breast: Secondary | ICD-10-CM

## 2020-10-08 ENCOUNTER — Ambulatory Visit
Admission: RE | Admit: 2020-10-08 | Discharge: 2020-10-08 | Disposition: A | Discharge status: Home / Self Care | Payer: 59 | Source: Ambulatory Visit | Attending: Obstetrics and Gynecology | Admitting: Obstetrics and Gynecology

## 2020-10-08 ENCOUNTER — Other Ambulatory Visit: Payer: Self-pay | Admitting: Obstetrics and Gynecology

## 2020-10-08 ENCOUNTER — Other Ambulatory Visit: Payer: Self-pay

## 2020-10-08 DIAGNOSIS — N63 Unspecified lump in unspecified breast: Secondary | ICD-10-CM

## 2020-12-18 ENCOUNTER — Other Ambulatory Visit: Payer: Self-pay | Admitting: Internal Medicine

## 2021-12-12 ENCOUNTER — Other Ambulatory Visit: Payer: Self-pay | Admitting: Obstetrics and Gynecology

## 2021-12-12 DIAGNOSIS — R928 Other abnormal and inconclusive findings on diagnostic imaging of breast: Secondary | ICD-10-CM

## 2022-01-22 ENCOUNTER — Other Ambulatory Visit: Payer: 59
